# Patient Record
Sex: Female | Born: 1975 | Race: White | Hispanic: No | Marital: Married | Smoking: Never smoker
Health system: Southern US, Community
[De-identification: ages and names within clinical notes are randomized; demographics above are authoritative.]

## PROBLEM LIST (undated history)

## (undated) ENCOUNTER — Inpatient Hospital Stay (HOSPITAL_COMMUNITY): Payer: Self-pay

## (undated) DIAGNOSIS — M25569 Pain in unspecified knee: Secondary | ICD-10-CM

## (undated) DIAGNOSIS — M722 Plantar fascial fibromatosis: Secondary | ICD-10-CM

## (undated) DIAGNOSIS — R519 Headache, unspecified: Secondary | ICD-10-CM

## (undated) DIAGNOSIS — T148XXA Other injury of unspecified body region, initial encounter: Secondary | ICD-10-CM

## (undated) DIAGNOSIS — M7660 Achilles tendinitis, unspecified leg: Secondary | ICD-10-CM

## (undated) DIAGNOSIS — F419 Anxiety disorder, unspecified: Secondary | ICD-10-CM

## (undated) DIAGNOSIS — I10 Essential (primary) hypertension: Secondary | ICD-10-CM

## (undated) DIAGNOSIS — R51 Headache: Secondary | ICD-10-CM

## (undated) HISTORY — DX: Anxiety disorder, unspecified: F41.9

## (undated) HISTORY — DX: Headache, unspecified: R51.9

## (undated) HISTORY — PX: WRIST FRACTURE SURGERY: SHX121

## (undated) HISTORY — DX: Other injury of unspecified body region, initial encounter: T14.8XXA

## (undated) HISTORY — DX: Essential (primary) hypertension: I10

## (undated) HISTORY — PX: WISDOM TOOTH EXTRACTION: SHX21

## (undated) HISTORY — DX: Headache: R51

---

## 1999-05-01 ENCOUNTER — Other Ambulatory Visit: Admission: RE | Admit: 1999-05-01 | Discharge: 1999-05-01 | Payer: Self-pay | Admitting: Gynecology

## 2000-05-01 ENCOUNTER — Other Ambulatory Visit: Admission: RE | Admit: 2000-05-01 | Discharge: 2000-05-01 | Payer: Self-pay | Admitting: Gynecology

## 2001-06-25 ENCOUNTER — Other Ambulatory Visit: Admission: RE | Admit: 2001-06-25 | Discharge: 2001-06-25 | Payer: Self-pay | Admitting: Gynecology

## 2003-02-26 ENCOUNTER — Other Ambulatory Visit: Admission: RE | Admit: 2003-02-26 | Discharge: 2003-02-26 | Payer: Self-pay | Admitting: Obstetrics and Gynecology

## 2003-08-18 ENCOUNTER — Emergency Department (HOSPITAL_COMMUNITY): Admission: EM | Admit: 2003-08-18 | Discharge: 2003-08-18 | Payer: Self-pay | Admitting: Emergency Medicine

## 2004-05-10 ENCOUNTER — Other Ambulatory Visit: Admission: RE | Admit: 2004-05-10 | Discharge: 2004-05-10 | Payer: Self-pay | Admitting: Obstetrics and Gynecology

## 2005-10-31 ENCOUNTER — Other Ambulatory Visit: Admission: RE | Admit: 2005-10-31 | Discharge: 2005-10-31 | Payer: Self-pay | Admitting: Obstetrics and Gynecology

## 2008-05-17 ENCOUNTER — Ambulatory Visit: Payer: Self-pay | Admitting: Sports Medicine

## 2008-05-17 DIAGNOSIS — M7660 Achilles tendinitis, unspecified leg: Secondary | ICD-10-CM | POA: Insufficient documentation

## 2008-05-17 DIAGNOSIS — M2240 Chondromalacia patellae, unspecified knee: Secondary | ICD-10-CM | POA: Insufficient documentation

## 2008-08-25 ENCOUNTER — Ambulatory Visit: Payer: Self-pay | Admitting: Sports Medicine

## 2008-11-02 ENCOUNTER — Ambulatory Visit: Payer: Self-pay | Admitting: Sports Medicine

## 2008-11-02 DIAGNOSIS — R269 Unspecified abnormalities of gait and mobility: Secondary | ICD-10-CM | POA: Insufficient documentation

## 2014-10-12 ENCOUNTER — Ambulatory Visit (INDEPENDENT_AMBULATORY_CARE_PROVIDER_SITE_OTHER): Payer: BLUE CROSS/BLUE SHIELD | Admitting: Family Medicine

## 2014-10-12 ENCOUNTER — Encounter: Payer: Self-pay | Admitting: Family Medicine

## 2014-10-12 VITALS — BP 126/69 | HR 54 | Ht 66.0 in | Wt 135.0 lb

## 2014-10-12 DIAGNOSIS — M722 Plantar fascial fibromatosis: Secondary | ICD-10-CM | POA: Diagnosis not present

## 2014-10-12 NOTE — Progress Notes (Signed)
  Brianna [name] - 39 y.o. female MRN [id number]  Date of birth: November 29, 1975  CC: No chief complaint on file.   SUBJECTIVE:   HPI  Right Heel pain for last 31month:  - Sitting and standing up to walk - NO pain in sneakers when working out -[employer] less supportive shoes hurt the most - Pain in the morning as well.  - IBU helped a little [place] 8-9 miles a day.   - Does role feet out on golf ball  - Ice pack does help a little.   - Although the pain worsens in [place] and is now back to the level it was prior to her trip.  No trouble with achilles issues on the left for which she was previously seen.   Has orthotics for running, made in 2010.  No issues.    ROS:     10 point review of systems negative other than that listed in history of present illness.  HISTORY: Past Medical, Surgical, Social, and Family History Reviewed & Updated per EMR.  Pertinent Historical Findings include: None  OBJECTIVE: BP 126/69 mmHg  Pulse 54  Ht 5' 6"  (1.676 m)  Wt 135 lb (61.236 kg)  BMI 21.80 kg/m2  Physical Exam  General: Well-developed,well-nourished,in no acute distress; alert,appropriate and cooperative throughout examination Msk: Pain over anterior-medial corner of right heel. Bilateral Achilles nontender to palpation Ankle: Full nonlimited range of motion No visible erythema or swelling. Strength is 5/5 in all directions. Stable lateral and medial ligaments. Talar dome nontender; No pain at base of 5th MT; No tenderness over cuboid; Able to walk 4 steps. Stance: Longitudinal and transverse arch appear well maintained. No calcaneus valgus Gait: Minimal overpronation.    MEDICATIONS, LABS & OTHER ORDERS: Previous Medications   IBUPROFEN (ADVIL,MOTRIN) 200 MG TABLET    Take 200 mg by mouth every 6 (six) hours as needed.   TRETINOIN (RETIN-A) 0.025 % CREAM    APPLY TOPICALLY TO SKIN AS DIRECTED.   Modified Medications   No medications on  file   New Prescriptions   No medications on file   Discontinued Medications   No medications on file  No orders of the defined types were placed in this encounter.   ASSESSMENT & PLAN: See problem based charting & AVS for pt instructions.

## 2014-10-13 DIAGNOSIS — M722 Plantar fascial fibromatosis: Secondary | ICD-10-CM | POA: Insufficient documentation

## 2014-10-13 DIAGNOSIS — M7741 Metatarsalgia, right foot: Secondary | ICD-10-CM | POA: Insufficient documentation

## 2014-10-13 DIAGNOSIS — M7742 Metatarsalgia, left foot: Secondary | ICD-10-CM | POA: Insufficient documentation

## 2014-10-13 NOTE — Assessment & Plan Note (Signed)
Patient with plantar fasciitis. The pain quickly resolves after beginning in activity, including walking for a few steps. We discussed various treatments. She is given the handout about doing exercises. We discussed icing and a tub of water versus a frozen water bottle. Unfortunately we did not have time to ultrasound her plantar fascia today. We did discuss the possibility of an injection although there is some risk for fat pad atrophy. She has not been doing any home treatments. She will begin stretching several times a day. She can follow-up as needed in 6-8 weeks if she is not improving. We did discuss the long recovery that is frequently common with plantar fasciitis.

## 2015-05-04 ENCOUNTER — Ambulatory Visit (INDEPENDENT_AMBULATORY_CARE_PROVIDER_SITE_OTHER): Payer: BLUE CROSS/BLUE SHIELD | Admitting: Sports Medicine

## 2015-05-04 VITALS — BP 126/72 | HR 58 | Ht 66.0 in | Wt 130.0 lb

## 2015-05-04 DIAGNOSIS — M545 Low back pain, unspecified: Secondary | ICD-10-CM | POA: Insufficient documentation

## 2015-05-04 DIAGNOSIS — M25562 Pain in left knee: Secondary | ICD-10-CM | POA: Diagnosis not present

## 2015-05-04 DIAGNOSIS — M25561 Pain in right knee: Secondary | ICD-10-CM | POA: Diagnosis not present

## 2015-05-04 DIAGNOSIS — M722 Plantar fascial fibromatosis: Secondary | ICD-10-CM | POA: Diagnosis not present

## 2015-05-04 NOTE — Progress Notes (Signed)
   Subjective:    Patient ID: Brianna [name], female    DOB: 05-22-1975, 40 y.o.   MRN: [id number]  HPI  40 yo female with hx of plantar fascitis and achillis tendonitis previously seen on 09/2014, now presents with low back pain and bilateral knee pain.  Knee pain: had this for many years, occasionally flares up, it's mainly on the anterior medial aspect of the knee joint, hurts more when she starts running, gets better after few minutes. Does not feel the pain much with rest. NSAIDs help with the pain. PT helped in the past. Wants to try that again.  Back pain: started 2 months ago after lifting a 50 lb dog. R>L. Located on lower back without radiation. No weakness, bowel or bladder problems.   Past Hx: Note that her PF and AT responded well to orthotics.  These are now 41 years old.  I reviewed the orthtotics and they still appear to be structurally sound.    Review of Systems  Constitutional: Negative for fever and chills.  Musculoskeletal: Positive for back pain and arthralgias. Negative for joint swelling, gait problem, neck pain and neck stiffness.  Neurological: Negative for dizziness, weakness and numbness.       Objective:   Physical Exam  Constitutional: She is oriented to person, place, and time. She appears well-developed and well-nourished. No distress.  HENT:  Head: Normocephalic and atraumatic.  Eyes: Conjunctivae are normal. Right eye exhibits no discharge. Left eye exhibits no discharge.  Musculoskeletal:  Has Left hip aBductor muscle weakness. Some peripatellar tenderness on the medial aspect of both knee joints. Has clicking on both knee joints, L>R.  Has tenderness over the right SI joint.  Has lumbar hyperlordosis.   Neurological: She is alert and oriented to person, place, and time.  Skin: Skin is warm. No rash noted. She is not diaphoretic.   Knee motion on left tracks laterally  Filed Vitals:   05/04/15 1120  BP: 126/72  Pulse: 58        Assessment  & Plan:  See problem based a&P.

## 2015-05-04 NOTE — Assessment & Plan Note (Signed)
Has bilateral knee pain with peripatellar tenderness and clicking L>R. This is likely 2/2 to weakness of her core muscles and hip muscles, especially weakness of left hip ABDuctor muscles.  - gave exercise to strengthen her hip and core muscles. Continue NSAID as needed.  - OK to keep running.

## 2015-05-04 NOTE — Assessment & Plan Note (Addendum)
Low back pain R>L, with tenderness over the right SI joint and hyperlordosis of lumbar spine.  With her hyperlodosis, she perhaps irritated her SI joint or back muscle after picking up a 50lb dog 2 months ago.  - gave her back flexion exercise series along with her hip exercises to use for PFP  She should show excellent progress with good HEP If not resolved in 6 wks - suggest RTC

## 2015-05-04 NOTE — Assessment & Plan Note (Signed)
Resolved on HX with use of custom orthotics

## 2015-09-20 DIAGNOSIS — E288 Other ovarian dysfunction: Secondary | ICD-10-CM | POA: Diagnosis not present

## 2015-09-26 DIAGNOSIS — N8500 Endometrial hyperplasia, unspecified: Secondary | ICD-10-CM | POA: Diagnosis not present

## 2015-10-07 DIAGNOSIS — L821 Other seborrheic keratosis: Secondary | ICD-10-CM | POA: Diagnosis not present

## 2015-10-07 DIAGNOSIS — D2261 Melanocytic nevi of right upper limb, including shoulder: Secondary | ICD-10-CM | POA: Diagnosis not present

## 2015-10-07 DIAGNOSIS — D225 Melanocytic nevi of trunk: Secondary | ICD-10-CM | POA: Diagnosis not present

## 2015-10-07 DIAGNOSIS — D2262 Melanocytic nevi of left upper limb, including shoulder: Secondary | ICD-10-CM | POA: Diagnosis not present

## 2015-10-17 DIAGNOSIS — E288 Other ovarian dysfunction: Secondary | ICD-10-CM | POA: Diagnosis not present

## 2015-10-17 DIAGNOSIS — Z319 Encounter for procreative management, unspecified: Secondary | ICD-10-CM | POA: Diagnosis not present

## 2015-11-05 DIAGNOSIS — E288 Other ovarian dysfunction: Secondary | ICD-10-CM | POA: Diagnosis not present

## 2015-11-16 DIAGNOSIS — Z3183 Encounter for assisted reproductive fertility procedure cycle: Secondary | ICD-10-CM | POA: Diagnosis not present

## 2015-11-19 DIAGNOSIS — N840 Polyp of corpus uteri: Secondary | ICD-10-CM | POA: Diagnosis not present

## 2015-11-21 DIAGNOSIS — N8500 Endometrial hyperplasia, unspecified: Secondary | ICD-10-CM | POA: Diagnosis not present

## 2015-12-14 DIAGNOSIS — N840 Polyp of corpus uteri: Secondary | ICD-10-CM | POA: Diagnosis not present

## 2015-12-20 DIAGNOSIS — N840 Polyp of corpus uteri: Secondary | ICD-10-CM | POA: Diagnosis not present

## 2016-01-16 NOTE — L&D Delivery Note (Signed)
Operative Delivery Note As previously stated a vacuum assisted delivery was decided upon due to FHR declerations.  The patient had pushed the baby to a +2 station OP presentation.  The vacuum was applied in the green zone 3 separate times with only one pop-off.  In between applications the patient was allowed to push on her own.  At 11:52 AM a healthy female was delivered via Vaginal, Vacuum Neurosurgeon).  Presentation: vertex; Position: Occiput,, Posterior; Station: +2.  Verbal consent: obtained from patient.  Risks and benefits discussed in detail.  Risks include, but are not limited to the risks of anesthesia, bleeding, infection, damage to maternal tissues, fetal cephalhematoma.  There is also the risk of inability to effect vaginal delivery of the head, or shoulder dystocia that cannot be resolved by established maneuvers, leading to the need for emergency cesarean section.  APGAR: 9, 9; weight pending .   Placenta status: had to be manually extracted (see below).   Cord:  with the following complications: nuchal x 1 .   Anesthesia:  epidural Instruments: soft bell vacuum Episiotomy: None Lacerations: 2nd degree with rectal sphincter reinforced Suture Repair: 3.0 vicryl rapide and 2-0 vicryl Est. Blood Loss (mL): 357m  After delivery of the baby pitocin was immediately started.  After waiting for 20 minutes, the placenta appeared to separate.  Fundal massage would not get the placenta to deliver, so manual extraction was attempted.  The placenta was partially detached, but still adherent at the top of fundus.  Multiple attempts were made at fundal exploration with fragments removed.  There was no excessive bleeding, but could not be confirmed that all tissue was out.  Cytotec was placed rectally and situation d/w pt.  Will observe bleeding and check CBC  Magnesium to begin now  Mom to postpartum.  Baby to Couplet care / Skin to Skin.  KLogan Bores11/14/2018, 1:19 PM

## 2016-02-10 DIAGNOSIS — N840 Polyp of corpus uteri: Secondary | ICD-10-CM | POA: Diagnosis not present

## 2016-03-14 DIAGNOSIS — E288 Other ovarian dysfunction: Secondary | ICD-10-CM | POA: Diagnosis not present

## 2016-03-16 DIAGNOSIS — Z3201 Encounter for pregnancy test, result positive: Secondary | ICD-10-CM | POA: Diagnosis not present

## 2016-03-28 DIAGNOSIS — Z3200 Encounter for pregnancy test, result unknown: Secondary | ICD-10-CM | POA: Diagnosis not present

## 2016-04-03 ENCOUNTER — Ambulatory Visit: Payer: Self-pay | Admitting: *Deleted

## 2016-04-16 DIAGNOSIS — O0900 Supervision of pregnancy with history of infertility, unspecified trimester: Secondary | ICD-10-CM | POA: Diagnosis not present

## 2016-05-03 DIAGNOSIS — O09511 Supervision of elderly primigravida, first trimester: Secondary | ICD-10-CM | POA: Diagnosis not present

## 2016-05-03 DIAGNOSIS — Z3689 Encounter for other specified antenatal screening: Secondary | ICD-10-CM | POA: Diagnosis not present

## 2016-05-03 DIAGNOSIS — Z3A11 11 weeks gestation of pregnancy: Secondary | ICD-10-CM | POA: Diagnosis not present

## 2016-05-03 DIAGNOSIS — O09811 Supervision of pregnancy resulting from assisted reproductive technology, first trimester: Secondary | ICD-10-CM | POA: Diagnosis not present

## 2016-05-03 DIAGNOSIS — Z113 Encounter for screening for infections with a predominantly sexual mode of transmission: Secondary | ICD-10-CM | POA: Diagnosis not present

## 2016-05-03 LAB — OB RESULTS CONSOLE HIV ANTIBODY (ROUTINE TESTING): HIV: NONREACTIVE

## 2016-05-03 LAB — OB RESULTS CONSOLE GC/CHLAMYDIA
Chlamydia: NEGATIVE
Gonorrhea: NEGATIVE

## 2016-05-03 LAB — OB RESULTS CONSOLE RPR: RPR: NONREACTIVE

## 2016-05-03 LAB — OB RESULTS CONSOLE HEPATITIS B SURFACE ANTIGEN: HEP B S AG: NEGATIVE

## 2016-05-03 LAB — OB RESULTS CONSOLE ABO/RH: RH TYPE: NEGATIVE

## 2016-05-03 LAB — OB RESULTS CONSOLE RUBELLA ANTIBODY, IGM: RUBELLA: IMMUNE

## 2016-05-03 LAB — OB RESULTS CONSOLE ANTIBODY SCREEN: Antibody Screen: NEGATIVE

## 2016-05-05 ENCOUNTER — Emergency Department (HOSPITAL_COMMUNITY)
Admission: EM | Admit: 2016-05-05 | Discharge: 2016-05-05 | Disposition: A | Discharge status: Home / Self Care | Payer: BLUE CROSS/BLUE SHIELD | Attending: Emergency Medicine | Admitting: Emergency Medicine

## 2016-05-05 ENCOUNTER — Encounter (HOSPITAL_COMMUNITY): Payer: Self-pay | Admitting: Emergency Medicine

## 2016-05-05 DIAGNOSIS — O99511 Diseases of the respiratory system complicating pregnancy, first trimester: Secondary | ICD-10-CM | POA: Diagnosis not present

## 2016-05-05 DIAGNOSIS — Z3A11 11 weeks gestation of pregnancy: Secondary | ICD-10-CM | POA: Insufficient documentation

## 2016-05-05 DIAGNOSIS — Z79899 Other long term (current) drug therapy: Secondary | ICD-10-CM | POA: Insufficient documentation

## 2016-05-05 DIAGNOSIS — R05 Cough: Secondary | ICD-10-CM | POA: Insufficient documentation

## 2016-05-05 DIAGNOSIS — R059 Cough, unspecified: Secondary | ICD-10-CM

## 2016-05-05 DIAGNOSIS — R0789 Other chest pain: Secondary | ICD-10-CM | POA: Diagnosis not present

## 2016-05-05 DIAGNOSIS — O9989 Other specified diseases and conditions complicating pregnancy, childbirth and the puerperium: Secondary | ICD-10-CM | POA: Insufficient documentation

## 2016-05-05 DIAGNOSIS — J029 Acute pharyngitis, unspecified: Secondary | ICD-10-CM

## 2016-05-05 NOTE — ED Provider Notes (Signed)
[facility] DEPT Provider Note   CSN: [id number] Arrival date & time: 05/05/16  1133  By signing my name below, I, Sonum Patel, attest that this documentation has been prepared under the direction and in the presence of [employer]. Electronically Signed: Sonum Patel, Education administrator. 05/05/16. 12:45 PM.  History   Chief Complaint Chief Complaint  Patient presents with  . Cough  . Nasal Congestion  . Chest Pain    The history is provided by the patient. No language interpreter was used.     HPI Comments: Brianna [name] is a G1P0, [redacted]w[redacted]d 41y.o. female who presents to the Emergency Department complaining of 2-3 days of bilateral lower anterior rib pain, right worse than left. She states the pain is unchanged with exertion but is aggravated by cough, deep breaths, and palpation (right only). She rates her pain as 2-3/10 currently and 8/10 at worst. She also complains of a sore throat, congestion, cough, rhinorrhea, watery eyes that has been ongoing for the last few weeks. She initially tried Robitussin and lozenges with some relief. Recently she tried anti-histamines with relief. She states the cough is mostly dry but occasionally productive of clear sputum in the morning. She states most of her symptoms improve throughout the day and return at night. She further complains of SOB with running; states this is new for her but resolves after the exercise. She denies abdominal pain, vomiting, vaginal bleeding/spotting, neck pain, back pain. She denies history of allergies or exertion induced asthma.   History reviewed. No pertinent past medical history.  Patient Active Problem List   Diagnosis Date Noted  . Low back pain 05/04/2015  . Bilateral knee pain 05/04/2015  . Plantar fasciitis, bilateral 10/13/2014  . ABNORMALITY OF GAIT 11/02/2008    Past Surgical History:  Procedure Laterality Date  . WRIST FRACTURE SURGERY     r/wrist    OB History    Gravida Para Term Preterm AB Living   1              SAB TAB Ectopic Multiple Live Births                   Home Medications    Prior to Admission medications   Medication Sig Start Date End Date Taking? Authorizing Provider  fluorometholone (FML) 0.1 % ophthalmic suspension INSTILL 1 DROP INTO BOTH EYES FOUR TIMES A DAY AS DIRECTED 04/22/15   Historical Provider, MD  ibuprofen (ADVIL,MOTRIN) 200 MG tablet Take 200 mg by mouth every 6 (six) hours as needed.    Historical Provider, MD  PAZEO 0.7 % SOLN INSTILL 1 DROP INTO BOTH EYES EVERY MORNING 04/11/15   Historical Provider, MD  tretinoin (RETIN-A) 0.025 % cream APPLY TOPICALLY TO SKIN AS DIRECTED. 09/03/14   Historical Provider, MD    Family History Family History  Problem Relation Age of Onset  . Hyperlipidemia Mother   . Hyperlipidemia Father     Social History Social History  Substance Use Topics  . Smoking status: Never Smoker  . Smokeless tobacco: Never Used  . Alcohol use No     Comment: pregnant     Allergies   Sulfa antibiotics   Review of Systems Review of Systems  HENT: Positive for congestion, rhinorrhea and sore throat.   Respiratory: Positive for cough and shortness of breath.   Cardiovascular: Positive for chest pain.  Gastrointestinal: Positive for nausea. Negative for abdominal pain and vomiting.  Genitourinary: Negative for vaginal bleeding and vaginal discharge.  Musculoskeletal: Negative for back pain and neck pain.     Physical Exam Updated Vital Signs BP 133/81   Pulse 60   Temp 97.8 F (36.6 C) (Oral)   Resp 16   Ht 5' 6"  (1.676 m)   Wt 63.5 kg   LMP 02/16/2016 (Exact Date)   SpO2 100%   BMI 22.60 kg/m   Physical Exam  Constitutional: She is oriented to person, place, and time. She appears well-developed and well-nourished.  HENT:  Head: Normocephalic and atraumatic.  TMs normal bilaterally without bulging or erythema. Nose with pink mucosa and mild green nasal discharge. Posterior pharynx mildly erythematous without  tonsillar hypertrophy. No exudates or uvular deviation.   Neck: Normal range of motion. Neck supple. No JVD present. No tracheal deviation present. No thyromegaly present.  Cardiovascular: Normal rate, regular rhythm, normal heart sounds and intact distal pulses.   2+ radial pulses bilaterally   Pulmonary/Chest: Effort normal and breath sounds normal. No respiratory distress. She has no wheezes. She has no rales. She exhibits tenderness.  Tenderness to palpation along right great costal margin, particularly under bra underwire. No deformity, bruising, or paradoxical motion of chest wall. Equal rise and fall of chest.   Abdominal: Soft. She exhibits no distension. There is no tenderness.  Lymphadenopathy:    She has no cervical adenopathy.  Neurological: She is alert and oriented to person, place, and time.  Skin: Skin is warm and dry.  Psychiatric: She has a normal mood and affect. Her behavior is normal.  Nursing note and vitals reviewed.    ED Treatments / Results  DIAGNOSTIC STUDIES: Oxygen Saturation is 100% on RA, normal by my interpretation.    COORDINATION OF CARE: 12:30 PM Discussed treatment plan with pt at bedside and pt agreed to plan.   Labs (all labs ordered are listed, but only abnormal results are displayed) Labs Reviewed - No data to display  EKG  EKG Interpretation None       Radiology No results found.  Procedures Procedures (including critical care time)  Medications Ordered in ED Medications - No data to display   Initial Impression / Assessment and Plan / ED Course  I have reviewed the triage vital signs and the nursing notes.  Pertinent labs & imaging results that were available during my care of the patient were reviewed by me and considered in my medical decision making (see chart for details).     Patient who is [redacted] weeks pregnant with anterior rib pain and associated cough and increased shortness of breath with exercise. Patient afebrile,  vital signs stable, SpO2 100% on RA. No wheezing on auscultation of lungs. Do not suspect pneumonia, bronchitis, or other cardiopulmonary abnormality. CXR not indicated at this time. Anterior rib pain is reproducible on palpation. Likely myofascial in nature, possible costochondritis. Pt will be discharged with symptomatic treatment, including Tylenol, ice/heat to the effected area for pain and avoidance of excessive exercise that will aggravate symptoms.  She will continue to use Claritin for her nasal congestion as it is helpful. Discussed strict return precautions. Patient will follow up with OB/GYN for reevaluation and further medication management. Pt is hemodynamically stable & in NAD prior to discharge. Pt verbalized understanding of and agreement with plan and is safe for discharge home at this time   Final Clinical Impressions(s) / ED Diagnoses   Final diagnoses:  Chest wall pain  Cough  Acute sore throat    New Prescriptions Discharge Medication List as of 05/05/2016 12:54  PM     I personally performed the services described in this documentation, which was scribed in my presence. The recorded information has been reviewed and is accurate.    Renita Papa, PA-C 05/06/16 2143    Forde Dandy, MD 05/09/16 1250

## 2016-05-05 NOTE — ED Triage Notes (Signed)
Pt reports a 4 week hx of pain in bilateral lower rib area. Non -productive cough x 4 weeks. Seen PCP 2 weeks ago. Denies fever. Shortness of breath while exercising . Sore throat at night.

## 2016-05-05 NOTE — Discharge Instructions (Signed)
Tylenol is okay for pain. Applying heat or ice to the affected area may be helpful. Continue to use Claritin for your nasal congestion and sore throat. Follow up with your PCP or OBGYN if symptoms worsen. If you develop worsening shortness of breath, severe ongoing pain, or any other concerning symptoms, return to the ED.

## 2016-05-14 DIAGNOSIS — Z3A12 12 weeks gestation of pregnancy: Secondary | ICD-10-CM | POA: Diagnosis not present

## 2016-05-14 DIAGNOSIS — O09511 Supervision of elderly primigravida, first trimester: Secondary | ICD-10-CM | POA: Diagnosis not present

## 2016-05-14 DIAGNOSIS — Z3682 Encounter for antenatal screening for nuchal translucency: Secondary | ICD-10-CM | POA: Diagnosis not present

## 2016-05-14 DIAGNOSIS — Z1379 Encounter for other screening for genetic and chromosomal anomalies: Secondary | ICD-10-CM | POA: Diagnosis not present

## 2016-05-26 ENCOUNTER — Inpatient Hospital Stay (HOSPITAL_COMMUNITY)
Admission: AD | Admit: 2016-05-26 | Discharge: 2016-05-26 | Disposition: A | Discharge status: Home / Self Care | Payer: BLUE CROSS/BLUE SHIELD | Source: Ambulatory Visit | Attending: Obstetrics and Gynecology | Admitting: Obstetrics and Gynecology

## 2016-05-26 ENCOUNTER — Encounter (HOSPITAL_COMMUNITY): Payer: Self-pay | Admitting: *Deleted

## 2016-05-26 DIAGNOSIS — Z6791 Unspecified blood type, Rh negative: Secondary | ICD-10-CM | POA: Diagnosis not present

## 2016-05-26 DIAGNOSIS — Z79899 Other long term (current) drug therapy: Secondary | ICD-10-CM | POA: Diagnosis not present

## 2016-05-26 DIAGNOSIS — O209 Hemorrhage in early pregnancy, unspecified: Secondary | ICD-10-CM | POA: Insufficient documentation

## 2016-05-26 DIAGNOSIS — O26892 Other specified pregnancy related conditions, second trimester: Secondary | ICD-10-CM

## 2016-05-26 DIAGNOSIS — O4692 Antepartum hemorrhage, unspecified, second trimester: Secondary | ICD-10-CM

## 2016-05-26 DIAGNOSIS — Z3A14 14 weeks gestation of pregnancy: Secondary | ICD-10-CM | POA: Insufficient documentation

## 2016-05-26 LAB — URINALYSIS, ROUTINE W REFLEX MICROSCOPIC
BILIRUBIN URINE: NEGATIVE
GLUCOSE, UA: NEGATIVE mg/dL
KETONES UR: NEGATIVE mg/dL
LEUKOCYTES UA: NEGATIVE
NITRITE: NEGATIVE
PH: 5.5 (ref 5.0–8.0)
PROTEIN: NEGATIVE mg/dL
Specific Gravity, Urine: <1.005 — ABNORMAL LOW (ref 1.005–1.030)

## 2016-05-26 LAB — URINALYSIS, MICROSCOPIC (REFLEX): WBC UA: NONE SEEN WBC/hpf (ref 0–5)

## 2016-05-26 MED ORDER — RHO D IMMUNE GLOBULIN 1500 UNIT/2ML IJ SOSY
300.0000 ug | PREFILLED_SYRINGE | Freq: Once | INTRAMUSCULAR | Status: AC
Start: 2016-05-26 — End: 2016-05-26
  Administered 2016-05-26: 300 ug via INTRAMUSCULAR
  Filled 2016-05-26: qty 2

## 2016-05-26 NOTE — Discharge Instructions (Signed)
Rh Incompatibility Rh incompatibility is a condition that occurs during pregnancy if a woman has Rh-negative blood and her baby has Rh-positive blood. "Rh-negative" and "Rh-positive" refer to whether or not the blood has an Rh factor. An Rh factor is a specific protein found on the surface of red blood cells. If a woman has Rh factor, she is Rh-positive. If she does not have an Rh factor, she is Rh-negative. Having or not having an Rh factor does not affect the mothers general health. However, it can cause problems during pregnancy. What kind of problems can Rh incompatibility cause? During pregnancy, blood from the baby can cross into the mothers bloodstream, especially during delivery. If a mother is Rh-negative and the baby is Rh-positive, the mothers defense system will react to the baby's blood as if it was a foreign substance and will create proteins (antibodies). This is called sensitization. Once the mother is sensitized, her Rh antibodies will cross the placenta to the baby and attack the babys Rh-positive blood as if it is a harmful substance. Rh incompatibility can also happen if the Rh-negative pregnant woman is exposed to the Rh factor during a blood transfusion with Rh-positive blood. How does this condition affect my baby? The Rh antibodies that attack and destroy the babys red blood cells can lead to hemolytic disease in the baby. Hemolytic disease is when the red blood cells break down. This can cause:  Yellowing of the skin and eyes (jaundice).  The body to not have enough healthy red blood cells (anemia).  Brain damage.  Heart failure.  Death. These antibodies usually do not cause problems during a first pregnancy. This is because the blood from the baby often times crosses into the mothers bloodstream during delivery, and the baby is born before many of the antibodies can develop. However, the antibodies stay in your body once they have formed. Because of this, Rh  incompatibility is more likely to cause problems in second or later pregnancies (if the baby is Rh-positive). How is this diagnosed? When a woman becomes pregnant, blood tests may be done to find out her blood type and Rh factor. If the woman is Rh-negative, she also may have another blood test called an antibody screen. The antibody screen shows whether she has Rh antibodies in her blood. If she does, it means she was exposed to Rh-positive blood before, and she is at risk for Rh incompatibility. To find out whether the baby is developing hemolytic anemia and how serious it is, caregivers may use more advanced tests, such as ultrasonography (commonly known as ultrasound). How is Rh incompatibility treated? Rh incompatibility is treated with a shot of medicine called Rho (D) immune globulin. This medicine keeps the woman's body from making antibodies that can cause serious problems in the baby or future babies. Two shots will be given, one at around your seventh month of pregnancy and the other within 72 hours of your baby being born. If you are Rh-negative, you will need this medicine every time you have a baby with Rh-positive blood. If you already have antibodies in your blood, Rho (D) immune globulin will not help. Your doctor will not give you this medicine, but will watch your pregnancy closely for problems instead. This shot may also be given to an Rh-negative woman when the risk of blood transfer between the mom and baby is high. The risk is high with:  An amniocentesis.  A miscarriage or an abortion.  An ectopic pregnancy.  Any vaginal  bleeding during pregnancy. This information is not intended to replace advice given to you by your health care provider. Make sure you discuss any questions you have with your health care provider. Document Released: 06/23/2001 Document Revised: 06/09/2015 Document Reviewed: 04/15/2012 Elsevier Interactive Patient Education  2017 [employer]. Vaginal  Bleeding During Pregnancy, First Trimester A small amount of bleeding (spotting) from the vagina is common in early pregnancy. Sometimes the bleeding is normal and is not a problem, and sometimes it is a sign of something serious. Be sure to tell your doctor about any bleeding from your vagina right away. Follow these instructions at home:  Watch your condition for any changes.  Follow your doctor's instructions about how active you can be.  If you are on bed rest:  You may need to stay in bed and only get up to use the bathroom.  You may be allowed to do some activities.  If you need help, make plans for someone to help you.  Write down:  The number of pads you use each day.  How often you change pads.  How soaked (saturated) your pads are.  Do not use tampons.  Do not douche.  Do not have sex or orgasms until your doctor says it is okay.  If you pass any tissue from your vagina, save the tissue so you can show it to your doctor.  Only take medicines as told by your doctor.  Do not take aspirin because it can make you bleed.  Keep all follow-up visits as told by your doctor. Contact a doctor if:  You bleed from your vagina.  You have cramps.  You have labor pains.  You have a fever that does not go away after you take medicine. Get help right away if:  You have very bad cramps in your back or belly (abdomen).  You pass large clots or tissue from your vagina.  You bleed more.  You feel light-headed or weak.  You pass out (faint).  You have chills.  You are leaking fluid or have a gush of fluid from your vagina.  You pass out while pooping (having a bowel movement). This information is not intended to replace advice given to you by your health care provider. Make sure you discuss any questions you have with your health care provider. Document Released: 05/18/2013 Document Revised: 06/09/2015 Document Reviewed: 09/08/2012 Elsevier Interactive Patient  Education  2017 [employer].

## 2016-05-26 NOTE — MAU Note (Signed)
Pt reports to MAU c/o vaginal bleeding that started around 2000. Pt denies saturating a pad but she did have to wear a panty liner. Pt states she has a "tiny bit of cramping in her lower abdomen" Pt states no other complaints at this time.

## 2016-05-26 NOTE — MAU Provider Note (Signed)
History     CSN: [id number]  Arrival date and time: 05/26/16 2049   First Provider Initiated Contact with Patient 05/26/16 2120      Chief Complaint  Patient presents with  . Vaginal Bleeding    14wk 2d   HPI   [name] is a 41 y.o. female G1P0 @ 47w2dhere in MAU with acute onset of bright red vaginal bleeding that started 1 hour ago. She was poring a glass of water at the time of onset. She has very mild, lower abdominal pain.   OB History    Gravida Para Term Preterm AB Living   1             SAB TAB Ectopic Multiple Live Births                  History reviewed. No pertinent past medical history.  Past Surgical History:  Procedure Laterality Date  . WRIST FRACTURE SURGERY     r/wrist    Family History  Problem Relation Age of Onset  . Hyperlipidemia Mother   . Hyperlipidemia Father     Social History  Substance Use Topics  . Smoking status: Never Smoker  . Smokeless tobacco: Never Used  . Alcohol use 0.0 oz/week     Comment: "sips of beer"     Allergies:  Allergies  Allergen Reactions  . Sulfa Antibiotics Nausea And Vomiting    Prescriptions Prior to Admission  Medication Sig Dispense Refill Last Dose  . acetaminophen (TYLENOL) 160 MG chewable tablet Chew 160 mg by mouth every 6 (six) hours as needed for pain.     .Marland Kitchenibuprofen (ADVIL,MOTRIN) 200 MG tablet Take 200 mg by mouth every 6 (six) hours as needed.   Past Month at Unknown time  . tretinoin (RETIN-A) 0.025 % cream APPLY TOPICALLY TO SKIN AS DIRECTED.  3 Past Week at Unknown time  . fluorometholone (FML) 0.1 % ophthalmic suspension INSTILL 1 DROP INTO BOTH EYES FOUR TIMES A DAY AS DIRECTED  0   . PAZEO 0.7 % SOLN INSTILL 1 DROP INTO BOTH EYES EVERY MORNING  1    Results for orders placed or performed during the hospital encounter of 05/26/16 (from the past 48 hour(s))  Urinalysis, Routine w reflex microscopic     Status: Abnormal   Collection Time: 05/26/16  8:54 PM  Result Value Ref  Range   Color, Urine YELLOW YELLOW   APPearance CLEAR CLEAR   Specific Gravity, Urine <1.005 (L) 1.005 - 1.030   pH 5.5 5.0 - 8.0   Glucose, UA NEGATIVE NEGATIVE mg/dL   Hgb urine dipstick LARGE (A) NEGATIVE   Bilirubin Urine NEGATIVE NEGATIVE   Ketones, ur NEGATIVE NEGATIVE mg/dL   Protein, ur NEGATIVE NEGATIVE mg/dL   Nitrite NEGATIVE NEGATIVE   Leukocytes, UA NEGATIVE NEGATIVE  Urinalysis, Microscopic (reflex)     Status: Abnormal   Collection Time: 05/26/16  8:54 PM  Result Value Ref Range   RBC / HPF 0-5 0 - 5 RBC/hpf   WBC, UA NONE SEEN 0 - 5 WBC/hpf   Bacteria, UA RARE (A) NONE SEEN   Squamous Epithelial / LPF 0-5 (A) NONE SEEN  Rh IG workup (includes ABO/Rh)     Status: None (Preliminary result)   Collection Time: 05/26/16  9:51 PM  Result Value Ref Range   Gestational Age(Wks) 14    ABO/RH(D) A NEG    Antibody Screen NEG    Unit Number [id number]   Blood  Component Type RHIG    Unit division 00    Status of Unit ISSUED    Transfusion Status OK TO TRANSFUSE     Review of Systems  Gastrointestinal: Positive for abdominal pain. Negative for nausea and vomiting.  Genitourinary: Positive for vaginal bleeding. Negative for dysuria.   Physical Exam   Blood pressure (!) 145/73, pulse 73, temperature 98.3 F (36.8 C), temperature source Oral, resp. rate 17, height 5' 6"  (1.676 m), weight 140 lb (63.5 kg), last menstrual period 02/16/2016.  Physical Exam  Constitutional: She is oriented to person, place, and time. She appears well-developed and well-nourished. No distress.  HENT:  Head: Normocephalic.  Eyes: Pupils are equal, round, and reactive to light.  Respiratory: Effort normal.  GI: Soft. She exhibits no distension. There is no tenderness. There is no rebound and no guarding.  Genitourinary:  Genitourinary Comments: Vagina - Small amount of dark red blood, not enough to soak large swab. No pooling of blood.  Cervix - No contact bleeding, no active bleeding,  visually closed.  Bimanual exam: deferred  Chaperone present for exam.   Musculoskeletal: Normal range of motion.  Neurological: She is alert and oriented to person, place, and time.  Skin: Skin is warm. She is not diaphoretic.  Psychiatric: Her behavior is normal.   MAU Course  Procedures  None  MDM  A negative blood type Rhogam given today Discussed with Dr. Melba Coon, bedside US shows active fetus + fetal heart tones via doppler   Assessment and Plan   A:  1. Vaginal bleeding in pregnancy, second trimester   2. Rh negative state in antepartum period, second trimester     P:  Discharge home in stable condition Pelvic rest Bleeding precautions Return to MAU if symptoms worsen  Follow up with OB next week.   Lezlie Lye, NP 05/26/2016 11:14 PM

## 2016-05-26 NOTE — MAU Note (Signed)
Urine sent to lab 

## 2016-05-27 LAB — RH IG WORKUP (INCLUDES ABO/RH)
ABO/RH(D): A NEG
ANTIBODY SCREEN: NEGATIVE
Gestational Age(Wks): 14
UNIT DIVISION: 0

## 2016-05-27 LAB — ABO/RH: ABO/RH(D): A NEG

## 2016-06-06 DIAGNOSIS — O09521 Supervision of elderly multigravida, first trimester: Secondary | ICD-10-CM | POA: Diagnosis not present

## 2016-06-06 DIAGNOSIS — O09511 Supervision of elderly primigravida, first trimester: Secondary | ICD-10-CM | POA: Diagnosis not present

## 2016-06-28 DIAGNOSIS — Z3A19 19 weeks gestation of pregnancy: Secondary | ICD-10-CM | POA: Diagnosis not present

## 2016-06-28 DIAGNOSIS — Z363 Encounter for antenatal screening for malformations: Secondary | ICD-10-CM | POA: Diagnosis not present

## 2016-06-28 DIAGNOSIS — O09512 Supervision of elderly primigravida, second trimester: Secondary | ICD-10-CM | POA: Diagnosis not present

## 2016-07-26 ENCOUNTER — Ambulatory Visit (INDEPENDENT_AMBULATORY_CARE_PROVIDER_SITE_OTHER): Payer: BLUE CROSS/BLUE SHIELD | Admitting: Sports Medicine

## 2016-07-26 ENCOUNTER — Encounter: Payer: Self-pay | Admitting: Sports Medicine

## 2016-07-26 DIAGNOSIS — R269 Unspecified abnormalities of gait and mobility: Secondary | ICD-10-CM

## 2016-07-26 NOTE — Assessment & Plan Note (Signed)
Custom orthotics > 42 years old Likely allowing too much pronation With scaphoid and heel wedges pronation is better controlled  I believe this triggered her heel and shin pain  Test this for 1 month  We need to make her new custom orthotics as older ones are breaking down

## 2016-07-26 NOTE — Progress Notes (Signed)
CC: Brianna [name]  Patient in custom orthotics Placed in custom orthotics in 2010 for rotational change and biomechanical problems Had a Hx of Achilles, PF and other injuries Has done really well w running since getting into orthotics  Now pregnant 3 to 4 mos In past month running causes anterior lateral Brianna pain on [name] Some heel and foot pain on [name] No pain first step of morning  ROS No LBP Heel pain feels different from past PF No sciatica  PE Pleasant pregnant F in NAD BP 120/60   Ht 5' 6"  (1.676 m)   Wt 150 lb (68 kg)   LMP 02/16/2016 (Exact Date)   BMI 24.21 kg/m   [name] ankle Ankle: No visible erythema or swelling. Range of motion is full in all directions. Strength is 5/5 in all directions. Stable lateral and medial ligaments; squeeze test and kleiger test unremarkable; Ligaments show some increased laxity Talar dome nontender; No pain at base of 5th MT; No tenderness over cuboid; No tenderness over N spot or navicular prominence No tenderness on posterior aspects of lateral and medial malleolus No sign of peroneal tendon subluxations; Negative tarsal tunnel tinel's Able to walk 4 steps.  No TTP anterior Brianna Excellent strength on resisted dorsiflexion, inversion and eversion  Running gait - too much pronation with some femoral rotation more on [name] Orthotics appears to be breaking down and not giving as much cushion or arch (41 yrs old)  After scaphoid and heel pads added - much more comfort with gait

## 2016-08-16 ENCOUNTER — Ambulatory Visit (INDEPENDENT_AMBULATORY_CARE_PROVIDER_SITE_OTHER): Payer: BLUE CROSS/BLUE SHIELD | Admitting: Sports Medicine

## 2016-08-16 DIAGNOSIS — R269 Unspecified abnormalities of gait and mobility: Secondary | ICD-10-CM

## 2016-08-16 DIAGNOSIS — M722 Plantar fascial fibromatosis: Secondary | ICD-10-CM | POA: Diagnosis not present

## 2016-08-16 NOTE — Assessment & Plan Note (Signed)
Patient was fitted for a : standard, cushioned, semi-rigid orthotic. The orthotic was heated and afterward the patient stood on the orthotic blank positioned on the orthotic stand. The patient was positioned in subtalar neutral position and 10 degrees of ankle dorsiflexion in a weight bearing stance. After completion of molding, a stable base was applied to the orthotic blank. The blank was ground to a stable position for weight bearing. Size: 7 red EVA Base: blue med. EVA Posting: none Additional orthotic padding:  None  Gait corrected and very comfortable post orthotic  I spent 30 minutes with this patient. Over 50% of visit was spend in counseling and coordination of care for problems with foot support and recurrent pain if not using orthotic.

## 2016-08-16 NOTE — Progress Notes (Signed)
CC: chronic foot and ankle pain  Seem 3 weeks ago Recurrent pain likely from breakdown of old orthotics not providing enough support Additional arch padding and repair did help Known biomechanical problems with gait  Past Hx - now 4 mos pregnant and more foot pressure  ROS No first step pain No heel pain today More  Pain along entire arc   Exam Pleasant F in NAD/ pregnant  BP 128/60   Ht 5' 6"  (1.676 m)   Wt 150 lb (68 kg)   LMP 02/16/2016 (Exact Date)   BMI 24.21 kg/m   Ankle: No visible erythema or swelling. Range of motion is full in all directions. Strength is 5/5 in all directions. Stable lateral and medial ligaments; squeeze test and kleiger test unremarkable; Talar dome nontender; No pain at base of 5th MT; No tenderness over cuboid; No tenderness over N spot or navicular prominence No tenderness on posterior aspects of lateral and medial malleolus No sign of peroneal tendon subluxations; Negative tarsal tunnel tinel's Able to walk 4 steps.  Feet show inward rotation Mild loss of long arch bilat   Walking gait - mildly pronated at rest and with walking Whip kick with running

## 2016-08-16 NOTE — Assessment & Plan Note (Signed)
This seems resolved today

## 2016-08-27 DIAGNOSIS — Z3689 Encounter for other specified antenatal screening: Secondary | ICD-10-CM | POA: Diagnosis not present

## 2016-08-27 DIAGNOSIS — Z23 Encounter for immunization: Secondary | ICD-10-CM | POA: Diagnosis not present

## 2016-08-27 DIAGNOSIS — O360120 Maternal care for anti-D [Rh] antibodies, second trimester, not applicable or unspecified: Secondary | ICD-10-CM | POA: Diagnosis not present

## 2016-08-27 DIAGNOSIS — Z6791 Unspecified blood type, Rh negative: Secondary | ICD-10-CM | POA: Diagnosis not present

## 2016-08-27 DIAGNOSIS — Z3A27 27 weeks gestation of pregnancy: Secondary | ICD-10-CM | POA: Diagnosis not present

## 2016-09-13 DIAGNOSIS — D224 Melanocytic nevi of scalp and neck: Secondary | ICD-10-CM | POA: Diagnosis not present

## 2016-09-13 DIAGNOSIS — L700 Acne vulgaris: Secondary | ICD-10-CM | POA: Diagnosis not present

## 2016-09-13 DIAGNOSIS — D2262 Melanocytic nevi of left upper limb, including shoulder: Secondary | ICD-10-CM | POA: Diagnosis not present

## 2016-09-13 DIAGNOSIS — L821 Other seborrheic keratosis: Secondary | ICD-10-CM | POA: Diagnosis not present

## 2016-10-01 DIAGNOSIS — Z1379 Encounter for other screening for genetic and chromosomal anomalies: Secondary | ICD-10-CM | POA: Diagnosis not present

## 2016-10-11 ENCOUNTER — Ambulatory Visit (INDEPENDENT_AMBULATORY_CARE_PROVIDER_SITE_OTHER): Payer: BLUE CROSS/BLUE SHIELD | Admitting: Sports Medicine

## 2016-10-11 ENCOUNTER — Ambulatory Visit: Payer: Self-pay

## 2016-10-11 VITALS — BP 118/76 | Ht 66.0 in | Wt 160.0 lb

## 2016-10-11 DIAGNOSIS — M84375A Stress fracture, left foot, initial encounter for fracture: Secondary | ICD-10-CM

## 2016-10-11 DIAGNOSIS — M79672 Pain in left foot: Secondary | ICD-10-CM

## 2016-10-11 NOTE — Assessment & Plan Note (Signed)
See OV Rigid post op shoe feels better with walking Arch strap Icing  Rest  Reck 4 wks

## 2016-10-11 NOTE — Progress Notes (Signed)
[facility] [address] [place], [place] [address] Phone: [phone] Fax: [phone]   Patient Name: Brianna [name] Date of Birth: 13-Mar-1975 Medical Record Number: [id number] Gender: female Date of Encounter: 10/11/2016  History of Present Illness:  Brianna [name] is a 41 y.o. very pleasant female patient who presents with the following:  Left foot pain for 2.5 weeks. Patient reports pain over the dorsal aspect of her left foot that is worse with weight bearing activities. Her pain has been limiting her ability to walk, exercise, and participate in yoga. She is currently 7 months pregnant. Last seen here on 08/16/16 and fitted for a standard cushioned semi-rigid orthotic due to gait abnormality causing heel pain. Her heal pain has improved significantly with the inserts, but developed new progressive pain on her dorsal left foot over the past few weeks. She recently returned from a trip to [place] where she spent a lot of time walking.   Patient Active Problem List   Diagnosis Date Noted  . Low back pain 05/04/2015  . Bilateral knee pain 05/04/2015  . Plantar fasciitis, bilateral 10/13/2014  . ABNORMALITY OF GAIT 11/02/2008   No past medical history on file. Past Surgical History:  Procedure Laterality Date  . WRIST FRACTURE SURGERY     r/wrist   Social History  Substance Use Topics  . Smoking status: Never Smoker  . Smokeless tobacco: Never Used  . Alcohol use 0.0 oz/week     Comment: "sips of beer"    Family History  Problem Relation Age of Onset  . Hyperlipidemia Mother   . Hyperlipidemia Father    Allergies  Allergen Reactions  . Sulfa Antibiotics Nausea And Vomiting    Medication list has been reviewed and updated.  Prior to Admission medications   Medication Sig Start Date End Date Taking? Authorizing Provider  acetaminophen (TYLENOL) 160 MG chewable tablet Chew 160 mg by mouth every 6 (six) hours as needed for pain.    [provider]  fluorometholone (FML) 0.1 % ophthalmic suspension INSTILL 1 DROP INTO BOTH EYES FOUR TIMES A DAY AS DIRECTED 04/22/15   [provider]  PAZEO 0.7 % SOLN INSTILL 1 DROP INTO BOTH EYES EVERY MORNING 04/11/15   [provider]    Review of Systems:  Endorses some mild bilateral feet swelling, endorses pain on her left foot. Denies heel pain.   Physical Examination: Vitals:   10/11/16 0906  BP: 118/76   Vitals:   10/11/16 0906  Weight: 160 lb (72.6 kg)  Height: 5' 6"  (1.676 m)   Body mass index is 25.82 kg/m.  Constitutional: NAD, pregnant  Extremities: Trace LE edema bilaterally. Left foot with point tenderness over the 3rd and 4th metatarsal. Strength and ROM intact.  Gait: Walks with limp to avoid bearing weight on left foot   Ultrasound of left foot   periosteal edema  irregularities of both the 3rd and 4th metatarsal concerning for possible stress fracture. Small cortical irregularity of 3rd MT with microcalcifications in area of TTP  Impression:  Consistent with early stress reaction or stress fracture of 3rd left metatarsal  Ultrasound and interpretation by Wolfgang Phoenix. Kyjuan Gause, MD   Assessment and Plan:  Stress Fracture: Pin point tenderness over the dorsal aspect of there 3rd and 4th metatarsal and ultrasound findings concerning for a stress fracture in the setting of pregnancy and likely a low calcium state.  -- Placed in post op rigid shoe -- Avoid exercise and weight bearing  activities for 3-4 weeks -- Return in 3 weeks for repeat ultrasound  -- Calcium supplements 1500 - 2500 mg QD   Velna Ochs, MD   I observed and examined the patient with the resident and agree with assessment and plan.  Note reviewed and modified by me. Stefanie Libel, MD

## 2016-10-15 DIAGNOSIS — Z3A34 34 weeks gestation of pregnancy: Secondary | ICD-10-CM | POA: Diagnosis not present

## 2016-10-15 DIAGNOSIS — Z23 Encounter for immunization: Secondary | ICD-10-CM | POA: Diagnosis not present

## 2016-10-23 DIAGNOSIS — Z3685 Encounter for antenatal screening for Streptococcus B: Secondary | ICD-10-CM | POA: Diagnosis not present

## 2016-10-23 DIAGNOSIS — Z3A35 35 weeks gestation of pregnancy: Secondary | ICD-10-CM | POA: Diagnosis not present

## 2016-10-23 LAB — OB RESULTS CONSOLE GBS: GBS: NEGATIVE

## 2016-11-01 ENCOUNTER — Ambulatory Visit (INDEPENDENT_AMBULATORY_CARE_PROVIDER_SITE_OTHER): Payer: BLUE CROSS/BLUE SHIELD | Admitting: Sports Medicine

## 2016-11-01 DIAGNOSIS — M84375D Stress fracture, left foot, subsequent encounter for fracture with routine healing: Secondary | ICD-10-CM | POA: Diagnosis not present

## 2016-11-05 NOTE — Progress Notes (Signed)
   HPI  CC: left foot pain Patient is presenting today for follow-up with her leftfoot pain.Patient states that she has been doing well. At the last visit she was placed in a postop shoe. She states that she has been compliant to a certain point, but recently transitioned to a more rigid but cushioned tennis shoe. She states the is feeling well overall and is pleased with her current progress.  Medications/Interventions Tried: wooden shoe, rest, calcium supplements  See HPI and/or previous note for associated ROS.  Objective: BP 120/76   Ht 5' 6"  (1.676 m)   Wt 170 lb (77.1 kg)   LMP 02/16/2016 (Exact Date)   BMI 27.44 kg/m  Gen: NAD, well groomed, a/o x3, normal affect.  CV: Well-perfused. Warm.  Resp: Non-labored.  Neuro: Sensation intact throughout. No gross coordination deficits.  Gait: Nonpathologic posture, unremarkable stride without signs of limp or balance issues. Ankle/Foot, Left: TTP very mild over the 3rd and 4th MT bases ( dorsal surface). No visible erythema, swelling, ecchymosis, or bony deformity. No notable pes planus/cavus deformity. Transverse arch grossly intact; No evidence of tibiotalar deviation; Range of motion is full in all directions. Strength is 5/5 in all directions. No tenderness at the insertion/body/myotendinous junction of the Achilles tendon; No peroneal tendon tenderness or subluxation; No tenderness on posterior aspects of lateral and medial malleolus; Talar dome nontender; Unremarkable calcaneal squeeze; No tenderness over the navicular prominence; No tenderness over cuboid; Able to walk 4 steps.   ULTRASOUND: Left Foot  Quick, limited diagnostic ultrasound obtained of patient's Left Metatarsals.  - transverse and short axis views of the third and fourth metatarsals showed  A normal 4th metatarsal. 3rd metatarsal with bony callus formation, increased  vascularity, and decreased periosteal fluid accumulation. No evidence of complete fracture, but 3rd  metatarsal showed irregularity over the bony surface/cortex suggesting previous incomplete stress fracture.   Assessment and plan:  Stress fracture of metatarsal bone of left foot Patient is presentiay with improved symptoms bony callus formation on ultrasound. findings are significantly reassuring. - heel cup provided today - Out of wooden shoe today - Activity as tolerated - Follow-up when necessary   Elberta Leatherwood, MD,MS [facility] 11/05/2016 7:53 AM

## 2016-11-05 NOTE — Assessment & Plan Note (Signed)
Patient is presentiay with improved symptoms bony callus formation on ultrasound. findings are significantly reassuring. - heel cup provided today - Out of wooden shoe today - Activity as tolerated - Follow-up when necessary

## 2016-11-08 DIAGNOSIS — Z3A38 38 weeks gestation of pregnancy: Secondary | ICD-10-CM | POA: Diagnosis not present

## 2016-11-08 DIAGNOSIS — O368199 Decreased fetal movements, unspecified trimester, other fetus: Secondary | ICD-10-CM | POA: Diagnosis not present

## 2016-11-20 ENCOUNTER — Telehealth (HOSPITAL_COMMUNITY): Payer: Self-pay | Admitting: *Deleted

## 2016-11-20 ENCOUNTER — Encounter (HOSPITAL_COMMUNITY): Payer: Self-pay | Admitting: *Deleted

## 2016-11-20 NOTE — Telephone Encounter (Signed)
Preadmission screen  

## 2016-11-27 ENCOUNTER — Encounter (HOSPITAL_COMMUNITY): Payer: Self-pay | Admitting: Certified Registered Nurse Anesthetist

## 2016-11-27 ENCOUNTER — Inpatient Hospital Stay (HOSPITAL_COMMUNITY)
Admission: AD | Admit: 2016-11-27 | Discharge: 2016-12-04 | DRG: 798 | Disposition: A | Discharge status: Rehabilitation Facility | Payer: BLUE CROSS/BLUE SHIELD | Source: Ambulatory Visit | Attending: Obstetrics and Gynecology | Admitting: Obstetrics and Gynecology

## 2016-11-27 ENCOUNTER — Encounter (HOSPITAL_COMMUNITY): Payer: Self-pay | Admitting: *Deleted

## 2016-11-27 DIAGNOSIS — I10 Essential (primary) hypertension: Secondary | ICD-10-CM | POA: Diagnosis not present

## 2016-11-27 DIAGNOSIS — O9A23 Injury, poisoning and certain other consequences of external causes complicating the puerperium: Secondary | ICD-10-CM | POA: Diagnosis not present

## 2016-11-27 DIAGNOSIS — O9081 Anemia of the puerperium: Secondary | ICD-10-CM | POA: Diagnosis not present

## 2016-11-27 DIAGNOSIS — O253 Malnutrition in the puerperium: Secondary | ICD-10-CM | POA: Diagnosis not present

## 2016-11-27 DIAGNOSIS — G5720 Lesion of femoral nerve, unspecified lower limb: Secondary | ICD-10-CM | POA: Diagnosis not present

## 2016-11-27 DIAGNOSIS — S7411XA Injury of femoral nerve at hip and thigh level, right leg, initial encounter: Secondary | ICD-10-CM | POA: Diagnosis not present

## 2016-11-27 DIAGNOSIS — Z8759 Personal history of other complications of pregnancy, childbirth and the puerperium: Secondary | ICD-10-CM

## 2016-11-27 DIAGNOSIS — Z3A00 Weeks of gestation of pregnancy not specified: Secondary | ICD-10-CM | POA: Diagnosis not present

## 2016-11-27 DIAGNOSIS — S7412XA Injury of femoral nerve at hip and thigh level, left leg, initial encounter: Secondary | ICD-10-CM | POA: Diagnosis not present

## 2016-11-27 DIAGNOSIS — O133 Gestational [pregnancy-induced] hypertension without significant proteinuria, third trimester: Secondary | ICD-10-CM | POA: Diagnosis not present

## 2016-11-27 DIAGNOSIS — O1414 Severe pre-eclampsia complicating childbirth: Principal | ICD-10-CM | POA: Diagnosis present

## 2016-11-27 DIAGNOSIS — N179 Acute kidney failure, unspecified: Secondary | ICD-10-CM | POA: Diagnosis not present

## 2016-11-27 DIAGNOSIS — G5723 Lesion of femoral nerve, bilateral lower limbs: Secondary | ICD-10-CM | POA: Diagnosis not present

## 2016-11-27 DIAGNOSIS — S7410XS Injury of femoral nerve at hip and thigh level, unspecified leg, sequela: Secondary | ICD-10-CM | POA: Diagnosis not present

## 2016-11-27 DIAGNOSIS — R609 Edema, unspecified: Secondary | ICD-10-CM | POA: Diagnosis not present

## 2016-11-27 DIAGNOSIS — R531 Weakness: Secondary | ICD-10-CM | POA: Diagnosis not present

## 2016-11-27 DIAGNOSIS — R2689 Other abnormalities of gait and mobility: Secondary | ICD-10-CM | POA: Diagnosis not present

## 2016-11-27 DIAGNOSIS — O1003 Pre-existing essential hypertension complicating the puerperium: Secondary | ICD-10-CM | POA: Diagnosis not present

## 2016-11-27 DIAGNOSIS — D62 Acute posthemorrhagic anemia: Secondary | ICD-10-CM | POA: Diagnosis not present

## 2016-11-27 DIAGNOSIS — E46 Unspecified protein-calorie malnutrition: Secondary | ICD-10-CM | POA: Diagnosis not present

## 2016-11-27 DIAGNOSIS — S344XXD Injury of lumbosacral plexus, subsequent encounter: Secondary | ICD-10-CM | POA: Diagnosis not present

## 2016-11-27 DIAGNOSIS — D696 Thrombocytopenia, unspecified: Secondary | ICD-10-CM | POA: Diagnosis not present

## 2016-11-27 DIAGNOSIS — O039 Complete or unspecified spontaneous abortion without complication: Secondary | ICD-10-CM | POA: Diagnosis not present

## 2016-11-27 DIAGNOSIS — Z3A40 40 weeks gestation of pregnancy: Secondary | ICD-10-CM

## 2016-11-27 DIAGNOSIS — O904 Postpartum acute kidney failure: Secondary | ICD-10-CM | POA: Diagnosis not present

## 2016-11-27 DIAGNOSIS — X58XXXD Exposure to other specified factors, subsequent encounter: Secondary | ICD-10-CM | POA: Diagnosis not present

## 2016-11-27 DIAGNOSIS — O411230 Chorioamnionitis, third trimester, not applicable or unspecified: Secondary | ICD-10-CM | POA: Diagnosis not present

## 2016-11-27 DIAGNOSIS — Z3490 Encounter for supervision of normal pregnancy, unspecified, unspecified trimester: Secondary | ICD-10-CM

## 2016-11-27 DIAGNOSIS — R030 Elevated blood-pressure reading, without diagnosis of hypertension: Secondary | ICD-10-CM | POA: Diagnosis not present

## 2016-11-27 HISTORY — DX: Pain in unspecified knee: M25.569

## 2016-11-27 HISTORY — DX: Achilles tendinitis, unspecified leg: M76.60

## 2016-11-27 HISTORY — DX: Plantar fascial fibromatosis: M72.2

## 2016-11-27 LAB — COMPREHENSIVE METABOLIC PANEL
ALK PHOS: 167 U/L — AB (ref 38–126)
ALT: 24 U/L (ref 14–54)
ANION GAP: 8 (ref 5–15)
AST: 39 U/L (ref 15–41)
Albumin: 3.2 g/dL — ABNORMAL LOW (ref 3.5–5.0)
BILIRUBIN TOTAL: 0.8 mg/dL (ref 0.3–1.2)
BUN: 20 mg/dL (ref 6–20)
CALCIUM: 8.6 mg/dL — AB (ref 8.9–10.3)
CO2: 19 mmol/L — AB (ref 22–32)
Chloride: 109 mmol/L (ref 101–111)
Creatinine, Ser: 1.02 mg/dL — ABNORMAL HIGH (ref 0.44–1.00)
GFR calc non Af Amer: >60 mL/min (ref >60)
Glucose, Bld: 80 mg/dL (ref 65–99)
Potassium: 5 mmol/L (ref 3.5–5.1)
SODIUM: 136 mmol/L (ref 135–145)
TOTAL PROTEIN: 5.8 g/dL — AB (ref 6.5–8.1)

## 2016-11-27 LAB — TYPE AND SCREEN
ABO/RH(D): A NEG
Antibody Screen: NEGATIVE

## 2016-11-27 LAB — CBC
HCT: 33.7 % — ABNORMAL LOW (ref 36.0–46.0)
HEMOGLOBIN: 12.3 g/dL (ref 12.0–15.0)
MCH: 32.5 pg (ref 26.0–34.0)
MCHC: 36.5 g/dL — ABNORMAL HIGH (ref 30.0–36.0)
MCV: 88.9 fL (ref 78.0–100.0)
PLATELETS: 141 10*3/uL — AB (ref 150–400)
RBC: 3.79 MIL/uL — AB (ref 3.87–5.11)
RDW: 13.1 % (ref 11.5–15.5)
WBC: 8.9 10*3/uL (ref 4.0–10.5)

## 2016-11-27 LAB — PROTEIN / CREATININE RATIO, URINE
CREATININE, URINE: 30 mg/dL
Protein Creatinine Ratio: 5.93 mg/mg{Cre} — ABNORMAL HIGH (ref 0.00–0.15)
Total Protein, Urine: 178 mg/dL

## 2016-11-27 MED ORDER — ACETAMINOPHEN 325 MG PO TABS: 650.0000 mg | ORAL_TABLET | ORAL | Status: DC | PRN

## 2016-11-27 MED ORDER — ONDANSETRON HCL 4 MG/2ML IJ SOLN: 4.0000 mg | Freq: Four times a day (QID) | INTRAMUSCULAR | Status: DC | PRN

## 2016-11-27 MED ORDER — BUTORPHANOL TARTRATE 1 MG/ML IJ SOLN
1.0000 mg | INTRAMUSCULAR | Status: DC | PRN
  Administered 2016-11-27 – 2016-11-28 (×2): 1 mg via INTRAVENOUS
  Filled 2016-11-27 (×2): qty 1

## 2016-11-27 MED ORDER — LACTATED RINGERS IV SOLN
500.0000 mL | INTRAVENOUS | Status: DC | PRN
  Administered 2016-11-28: 1000 mL via INTRAVENOUS
  Administered 2016-11-28: 500 mL via INTRAVENOUS

## 2016-11-27 MED ORDER — LABETALOL HCL 5 MG/ML IV SOLN
20.0000 mg | INTRAVENOUS | Status: AC | PRN
  Administered 2016-11-27 (×3): 20 mg via INTRAVENOUS
  Filled 2016-11-27: qty 8
  Filled 2016-11-27 (×2): qty 4

## 2016-11-27 MED ORDER — MISOPROSTOL 25 MCG QUARTER TABLET
25.0000 ug | ORAL_TABLET | Freq: Three times a day (TID) | ORAL | Status: DC
  Filled 2016-11-27 (×3): qty 1

## 2016-11-27 MED ORDER — SOD CITRATE-CITRIC ACID 500-334 MG/5ML PO SOLN
30.0000 mL | ORAL | Status: DC | PRN
  Filled 2016-11-27: qty 15

## 2016-11-27 MED ORDER — EPHEDRINE 5 MG/ML INJ: 10.0000 mg | INTRAVENOUS | Status: DC | PRN

## 2016-11-27 MED ORDER — OXYCODONE-ACETAMINOPHEN 5-325 MG PO TABS: 2.0000 | ORAL_TABLET | ORAL | Status: DC | PRN

## 2016-11-27 MED ORDER — MISOPROSTOL 200 MCG PO TABS
50.0000 ug | ORAL_TABLET | Freq: Three times a day (TID) | ORAL | Status: DC
  Administered 2016-11-27: 50 ug via ORAL
  Filled 2016-11-27: qty 1

## 2016-11-27 MED ORDER — DIPHENHYDRAMINE HCL 50 MG/ML IJ SOLN: 12.5000 mg | INTRAMUSCULAR | Status: DC | PRN

## 2016-11-27 MED ORDER — OXYCODONE-ACETAMINOPHEN 5-325 MG PO TABS: 1.0000 | ORAL_TABLET | ORAL | Status: DC | PRN

## 2016-11-27 MED ORDER — HYDRALAZINE HCL 20 MG/ML IJ SOLN
10.0000 mg | Freq: Once | INTRAMUSCULAR | Status: AC | PRN
  Administered 2016-11-27: 10 mg via INTRAVENOUS
  Filled 2016-11-27: qty 1

## 2016-11-27 MED ORDER — LIDOCAINE HCL (PF) 1 % IJ SOLN
30.0000 mL | INTRAMUSCULAR | Status: DC | PRN
  Filled 2016-11-27: qty 30

## 2016-11-27 MED ORDER — LACTATED RINGERS IV SOLN
500.0000 mL | Freq: Once | INTRAVENOUS | Status: AC
  Administered 2016-11-28: 500 mL via INTRAVENOUS

## 2016-11-27 MED ORDER — OXYTOCIN BOLUS FROM INFUSION: 500.0000 mL | Freq: Once | INTRAVENOUS | Status: DC

## 2016-11-27 MED ORDER — OXYTOCIN 40 UNITS IN LACTATED RINGERS INFUSION - SIMPLE MED
2.5000 [IU]/h | INTRAVENOUS | Status: DC
  Administered 2016-11-28: 4 [IU]/h via INTRAVENOUS
  Administered 2016-11-28 (×2): 2.5 [IU]/h via INTRAVENOUS
  Filled 2016-11-27: qty 1000

## 2016-11-27 MED ORDER — FENTANYL 2.5 MCG/ML BUPIVACAINE 1/10 % EPIDURAL INFUSION (WH - ANES)
14.0000 mL/h | INTRAMUSCULAR | Status: DC | PRN
  Administered 2016-11-28: 14 mL/h via EPIDURAL
  Filled 2016-11-27: qty 100

## 2016-11-27 MED ORDER — PHENYLEPHRINE 40 MCG/ML (10ML) SYRINGE FOR IV PUSH (FOR BLOOD PRESSURE SUPPORT): 80.0000 ug | PREFILLED_SYRINGE | INTRAVENOUS | Status: DC | PRN

## 2016-11-27 MED ORDER — LACTATED RINGERS IV SOLN
INTRAVENOUS | Status: DC
  Administered 2016-11-28 (×3): via INTRAVENOUS

## 2016-11-27 MED ORDER — PHENYLEPHRINE 40 MCG/ML (10ML) SYRINGE FOR IV PUSH (FOR BLOOD PRESSURE SUPPORT)
80.0000 ug | PREFILLED_SYRINGE | INTRAVENOUS | Status: DC | PRN
  Filled 2016-11-27: qty 10

## 2016-11-27 NOTE — Anesthesia Pain Management Evaluation Note (Signed)
  CRNA Pain Management Visit Note  Patient: Brianna [name], 41 y.o., female  "Hello I am a member of the anesthesia team at [facility]. We have an anesthesia team available at all times to provide care throughout the hospital, including epidural management and anesthesia for C-section. I don't know your plan for the delivery whether it a natural birth, water birth, IV sedation, nitrous supplementation, doula or epidural, but we want to meet your pain goals."   1.Was your pain managed to your expectations on prior hospitalizations?   No prior hospitalizations  2.What is your expectation for pain management during this hospitalization?     Epidural  3.How can we help you reach that goal?   Record the patient's initial score and the patient's pain goal.   Pain: 2  Pain Goal: 7 [facility] wants you to be able to say your pain was always managed very well.  Jabier Mutton 11/27/2016

## 2016-11-27 NOTE — H&P (Addendum)
Brianna [name] is a 41 y.o. female G1P0 at 79 5/7 weeks (EDD 11/22/16 by known [facility] with IVF)   presenting for IOL with elevated BP of new onset and 2+ proteinuria.  Denies PIH sx of HA or other problems.  BP in office today 152/98.  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   OB History    Gravida Para Term Preterm AB Living   1             SAB TAB Ectopic Multiple Live Births                 Past Medical History:  Diagnosis Date  . Headache   . Newborn product of in vitro fertilization (IVF) pregnancy    Past Surgical History:  Procedure Laterality Date  . WISDOM TOOTH EXTRACTION    . WRIST FRACTURE SURGERY     r/wrist   Family History: family history includes Hyperlipidemia in her father and mother. Social History:  reports that  has never smoked. she has never used smokeless tobacco. She reports that she drinks alcohol. She reports that she does not use drugs.     Maternal Diabetes: No Genetic Screening: Normal Maternal Ultrasounds/Referrals: Normal Fetal Ultrasounds or other Referrals:  None Maternal Substance Abuse:  No Significant Maternal Medications:  None Significant Maternal Lab Results:  None Other Comments:  None  Review of Systems  Gastrointestinal: Negative for abdominal pain.  Neurological: Negative for headaches.   Maternal Medical History:  Contractions: Frequency: irregular.   Perceived severity is mild.    Fetal activity: Perceived fetal activity is normal.    Prenatal complications: PIH.   Prenatal Complications - Diabetes: none.      Last menstrual period 02/16/2016. Maternal Exam:  Uterine Assessment: Contraction strength is mild.  Contraction frequency is irregular.   Abdomen: Patient  reports no abdominal tenderness. Fetal presentation: vertex  Introitus: Normal vulva. Normal vagina.    Physical Exam  Constitutional: She is oriented to person, place, and time. She appears well-developed.  Cardiovascular: Normal rate and regular rhythm.  GI: Soft.  Genitourinary: Vagina normal.  Neurological: She is alert and oriented to person, place, and time.  Psychiatric: She has a normal mood and affect.    Prenatal labs: ABO, Rh: --/--/A NEG, A NEG (05/12 2151) Antibody: NEG (05/12 2151) Rubella: Immune (04/19 0000) RPR: Nonreactive (04/19 0000)  HBsAg: Negative (04/19 0000)  HIV: Non-reactive (04/19 0000)  GBS: Negative (10/09 0000)  Panorama low risk, female One hour GCT 85  Assessment/Plan: Pt for ripening and IOL given new onset elevated BP and proteinuria on dipstick.  Will check [place] labs on admission including a prot/creat ratio. Treat BP as needed.  Magnesium if severe features noted.  Cytotec then pitocin when able.    Logan Bores 11/27/2016, 3:48 PM

## 2016-11-27 NOTE — Progress Notes (Signed)
Patient ID: Brianna [name], female   DOB: 17-Jun-1975, 41 y.o.   MRN: [id number]   Reviewed IOL with pt.  Pt contracting too much for cytotec, so will place foley bulb.    AF 153-188/73-95, has been treated with labetalol x 1 gen NAD FHTs 135, mod var, + accels, category 1  toco q 2-48mn  Foley bulb place w/o diff/comp, to traction taped to R leg  Continue IOL, Cytotec if possible

## 2016-11-27 NOTE — Progress Notes (Signed)
Patient ID: Brianna [name], female   DOB: [birth year], 41 y.o.   MRN: [id number]   Pt w/o sx's, but BP elevated, has been treated on several occasions. Pt with some pain  Foley bulb intact  Will consider magnesium.    130's, mod var, category 1 toco 2-5mn

## 2016-11-28 ENCOUNTER — Encounter (HOSPITAL_COMMUNITY): Payer: Self-pay | Admitting: *Deleted

## 2016-11-28 ENCOUNTER — Inpatient Hospital Stay (HOSPITAL_COMMUNITY): Payer: BLUE CROSS/BLUE SHIELD | Admitting: Anesthesiology

## 2016-11-28 ENCOUNTER — Inpatient Hospital Stay (HOSPITAL_COMMUNITY): Payer: BLUE CROSS/BLUE SHIELD

## 2016-11-28 ENCOUNTER — Inpatient Hospital Stay (HOSPITAL_COMMUNITY): Admission: RE | Admit: 2016-11-28 | Payer: BLUE CROSS/BLUE SHIELD | Source: Ambulatory Visit

## 2016-11-28 ENCOUNTER — Encounter (HOSPITAL_COMMUNITY)
Admission: AD | Disposition: A | Discharge status: Rehabilitation Facility | Payer: Self-pay | Source: Ambulatory Visit | Attending: Obstetrics and Gynecology

## 2016-11-28 DIAGNOSIS — Z8759 Personal history of other complications of pregnancy, childbirth and the puerperium: Secondary | ICD-10-CM

## 2016-11-28 HISTORY — PX: DILATION AND CURETTAGE OF UTERUS: SHX78

## 2016-11-28 LAB — CBC
HCT: 34.4 % — ABNORMAL LOW (ref 36.0–46.0)
HCT: 33.4 % — ABNORMAL LOW (ref 36.0–46.0)
HCT: 31.2 % — ABNORMAL LOW (ref 36.0–46.0)
HEMOGLOBIN: 11.6 g/dL — AB (ref 12.0–15.0)
HEMOGLOBIN: 10.8 g/dL — AB (ref 12.0–15.0)
Hemoglobin: 12.2 g/dL (ref 12.0–15.0)
MCH: 31.9 pg (ref 26.0–34.0)
MCH: 32.2 pg (ref 26.0–34.0)
MCH: 31.7 pg (ref 26.0–34.0)
MCHC: 35.5 g/dL (ref 30.0–36.0)
MCHC: 34.7 g/dL (ref 30.0–36.0)
MCHC: 34.6 g/dL (ref 30.0–36.0)
MCV: 90.1 fL (ref 78.0–100.0)
MCV: 92.8 fL (ref 78.0–100.0)
MCV: 91.5 fL (ref 78.0–100.0)
PLATELETS: 140 10*3/uL — AB (ref 150–400)
PLATELETS: 113 10*3/uL — AB (ref 150–400)
Platelets: 138 10*3/uL — ABNORMAL LOW (ref 150–400)
RBC: 3.82 MIL/uL — AB (ref 3.87–5.11)
RBC: 3.6 MIL/uL — ABNORMAL LOW (ref 3.87–5.11)
RBC: 3.41 MIL/uL — AB (ref 3.87–5.11)
RDW: 13.4 % (ref 11.5–15.5)
RDW: 13.4 % (ref 11.5–15.5)
RDW: 13.3 % (ref 11.5–15.5)
WBC: 13.6 10*3/uL — ABNORMAL HIGH (ref 4.0–10.5)
WBC: 18.5 10*3/uL — ABNORMAL HIGH (ref 4.0–10.5)
WBC: 15.6 10*3/uL — AB (ref 4.0–10.5)

## 2016-11-28 LAB — COMPREHENSIVE METABOLIC PANEL
ALK PHOS: 147 U/L — AB (ref 38–126)
ALT: 19 U/L (ref 14–54)
ANION GAP: 9 (ref 5–15)
AST: 39 U/L (ref 15–41)
Albumin: 2.2 g/dL — ABNORMAL LOW (ref 3.5–5.0)
BUN: 24 mg/dL — ABNORMAL HIGH (ref 6–20)
CALCIUM: 7.2 mg/dL — AB (ref 8.9–10.3)
CO2: 17 mmol/L — AB (ref 22–32)
CREATININE: 1.84 mg/dL — AB (ref 0.44–1.00)
Chloride: 100 mmol/L — ABNORMAL LOW (ref 101–111)
GFR, EST AFRICAN AMERICAN: 38 mL/min — AB (ref >60)
GFR, EST NON AFRICAN AMERICAN: 33 mL/min — AB (ref >60)
Glucose, Bld: 88 mg/dL (ref 65–99)
Potassium: 5.1 mmol/L (ref 3.5–5.1)
SODIUM: 126 mmol/L — AB (ref 135–145)
TOTAL PROTEIN: 4.4 g/dL — AB (ref 6.5–8.1)
Total Bilirubin: 0.7 mg/dL (ref 0.3–1.2)

## 2016-11-28 LAB — RPR: RPR: NONREACTIVE

## 2016-11-28 SURGERY — DILATION AND CURETTAGE
Anesthesia: Epidural

## 2016-11-28 MED ORDER — MAGNESIUM SULFATE BOLUS VIA INFUSION
4.0000 g | Freq: Once | INTRAVENOUS | Status: AC
  Administered 2016-11-28: 4 g via INTRAVENOUS
  Filled 2016-11-28: qty 500

## 2016-11-28 MED ORDER — SODIUM BICARBONATE 8.4 % IV SOLN
INTRAVENOUS | Status: DC | PRN
  Administered 2016-11-28: 3 mL via EPIDURAL
  Administered 2016-11-28 (×2): 5 mL via EPIDURAL
  Administered 2016-11-28: 2 mL via EPIDURAL
  Administered 2016-11-28: 5 mL via EPIDURAL

## 2016-11-28 MED ORDER — TETANUS-DIPHTH-ACELL PERTUSSIS 5-2.5-18.5 LF-MCG/0.5 IM SUSP: 0.5000 mL | Freq: Once | INTRAMUSCULAR | Status: DC

## 2016-11-28 MED ORDER — ONDANSETRON HCL 4 MG/2ML IJ SOLN
INTRAMUSCULAR | Status: AC
  Filled 2016-11-28: qty 2

## 2016-11-28 MED ORDER — EPHEDRINE 5 MG/ML INJ
INTRAVENOUS | Status: AC
  Filled 2016-11-28: qty 10

## 2016-11-28 MED ORDER — IBUPROFEN 600 MG PO TABS
600.0000 mg | ORAL_TABLET | Freq: Four times a day (QID) | ORAL | Status: DC
  Administered 2016-11-28 – 2016-11-29 (×2): 600 mg via ORAL
  Filled 2016-11-28 (×3): qty 1

## 2016-11-28 MED ORDER — METOCLOPRAMIDE HCL 5 MG/ML IJ SOLN
INTRAMUSCULAR | Status: AC
  Filled 2016-11-28: qty 2

## 2016-11-28 MED ORDER — SODIUM BICARBONATE 8.4 % IV SOLN
INTRAVENOUS | Status: AC
  Filled 2016-11-28: qty 50

## 2016-11-28 MED ORDER — OXYCODONE HCL 5 MG PO TABS: 10.0000 mg | ORAL_TABLET | ORAL | Status: DC | PRN

## 2016-11-28 MED ORDER — SODIUM CHLORIDE 0.9 % IV SOLN
3.0000 g | Freq: Once | INTRAVENOUS | Status: AC
  Administered 2016-11-28: 3 g via INTRAVENOUS
  Filled 2016-11-28: qty 3

## 2016-11-28 MED ORDER — ONDANSETRON HCL 4 MG/2ML IJ SOLN
INTRAMUSCULAR | Status: DC | PRN
  Administered 2016-11-28: 4 mg via INTRAVENOUS

## 2016-11-28 MED ORDER — LIDOCAINE HCL (PF) 1 % IJ SOLN
INTRAMUSCULAR | Status: DC | PRN
  Administered 2016-11-28: 8 mL
  Administered 2016-11-28: 5 mL

## 2016-11-28 MED ORDER — ONDANSETRON HCL 4 MG/2ML IJ SOLN: 4.0000 mg | INTRAMUSCULAR | Status: DC | PRN

## 2016-11-28 MED ORDER — DEXAMETHASONE SODIUM PHOSPHATE 10 MG/ML IJ SOLN
INTRAMUSCULAR | Status: AC
  Filled 2016-11-28: qty 1

## 2016-11-28 MED ORDER — OXYTOCIN 10 UNIT/ML IJ SOLN
INTRAMUSCULAR | Status: DC | PRN
  Administered 2016-11-28: 20 [IU] via INTRAMUSCULAR

## 2016-11-28 MED ORDER — BENZOCAINE-MENTHOL 20-0.5 % EX AERO: 1.0000 "application " | INHALATION_SPRAY | CUTANEOUS | Status: DC | PRN

## 2016-11-28 MED ORDER — ACETAMINOPHEN 325 MG PO TABS
650.0000 mg | ORAL_TABLET | ORAL | Status: DC | PRN
  Administered 2016-11-30: 650 mg via ORAL
  Filled 2016-11-28 (×2): qty 2

## 2016-11-28 MED ORDER — MIDAZOLAM HCL 2 MG/2ML IJ SOLN
INTRAMUSCULAR | Status: DC | PRN
  Administered 2016-11-28 (×2): 1 mg via INTRAVENOUS

## 2016-11-28 MED ORDER — TERBUTALINE SULFATE 1 MG/ML IJ SOLN: 0.2500 mg | Freq: Once | INTRAMUSCULAR | Status: DC | PRN

## 2016-11-28 MED ORDER — OXYTOCIN 10 UNIT/ML IJ SOLN
INTRAMUSCULAR | Status: AC
  Filled 2016-11-28: qty 2

## 2016-11-28 MED ORDER — DIPHENHYDRAMINE HCL 25 MG PO CAPS: 25.0000 mg | ORAL_CAPSULE | Freq: Four times a day (QID) | ORAL | Status: DC | PRN

## 2016-11-28 MED ORDER — EPHEDRINE SULFATE 50 MG/ML IJ SOLN
INTRAMUSCULAR | Status: DC | PRN
  Administered 2016-11-28 (×4): 10 mg via INTRAVENOUS

## 2016-11-28 MED ORDER — FENTANYL CITRATE (PF) 100 MCG/2ML IJ SOLN
INTRAMUSCULAR | Status: DC | PRN
  Administered 2016-11-28: 25 ug via INTRAVENOUS
  Administered 2016-11-28: 50 ug via INTRAVENOUS
  Administered 2016-11-28: 25 ug via INTRAVENOUS

## 2016-11-28 MED ORDER — DEXAMETHASONE SODIUM PHOSPHATE 10 MG/ML IJ SOLN
INTRAMUSCULAR | Status: DC | PRN
  Administered 2016-11-28: 10 mg via INTRAVENOUS

## 2016-11-28 MED ORDER — MISOPROSTOL 200 MCG PO TABS
ORAL_TABLET | ORAL | Status: AC
  Filled 2016-11-28: qty 3

## 2016-11-28 MED ORDER — MIDAZOLAM HCL 2 MG/2ML IJ SOLN
INTRAMUSCULAR | Status: AC
  Filled 2016-11-28: qty 2

## 2016-11-28 MED ORDER — OXYTOCIN 10 UNIT/ML IJ SOLN
INTRAMUSCULAR | Status: AC
  Filled 2016-11-28: qty 4

## 2016-11-28 MED ORDER — LIDOCAINE-EPINEPHRINE (PF) 2 %-1:200000 IJ SOLN
INTRAMUSCULAR | Status: AC
  Filled 2016-11-28: qty 20

## 2016-11-28 MED ORDER — FENTANYL CITRATE (PF) 100 MCG/2ML IJ SOLN
INTRAMUSCULAR | Status: AC
  Filled 2016-11-28: qty 2

## 2016-11-28 MED ORDER — SIMETHICONE 80 MG PO CHEW: 80.0000 mg | CHEWABLE_TABLET | ORAL | Status: DC | PRN

## 2016-11-28 MED ORDER — MORPHINE SULFATE (PF) 0.5 MG/ML IJ SOLN
INTRAMUSCULAR | Status: AC
  Filled 2016-11-28: qty 10

## 2016-11-28 MED ORDER — PRENATAL MULTIVITAMIN CH
1.0000 | ORAL_TABLET | Freq: Every day | ORAL | Status: DC
  Administered 2016-11-29 – 2016-12-03 (×4): 1 via ORAL
  Filled 2016-11-28 (×5): qty 1

## 2016-11-28 MED ORDER — ONDANSETRON HCL 4 MG PO TABS: 4.0000 mg | ORAL_TABLET | ORAL | Status: DC | PRN

## 2016-11-28 MED ORDER — MAGNESIUM SULFATE 40 G IN LACTATED RINGERS - SIMPLE
2.0000 g/h | INTRAVENOUS | Status: DC
  Administered 2016-11-29: 2 g/h via INTRAVENOUS
  Filled 2016-11-28: qty 500
  Filled 2016-11-28: qty 40

## 2016-11-28 MED ORDER — MISOPROSTOL 200 MCG PO TABS
600.0000 ug | ORAL_TABLET | Freq: Once | ORAL | Status: AC
  Administered 2016-11-28: 600 ug via RECTAL

## 2016-11-28 MED ORDER — OXYCODONE HCL 5 MG PO TABS: 5.0000 mg | ORAL_TABLET | ORAL | Status: DC | PRN

## 2016-11-28 MED ORDER — WITCH HAZEL-GLYCERIN EX PADS: 1.0000 "application " | MEDICATED_PAD | CUTANEOUS | Status: DC | PRN

## 2016-11-28 MED ORDER — PHENYLEPHRINE 40 MCG/ML (10ML) SYRINGE FOR IV PUSH (FOR BLOOD PRESSURE SUPPORT)
PREFILLED_SYRINGE | INTRAVENOUS | Status: AC
  Filled 2016-11-28: qty 10

## 2016-11-28 MED ORDER — ZOLPIDEM TARTRATE 5 MG PO TABS: 5.0000 mg | ORAL_TABLET | Freq: Every evening | ORAL | Status: DC | PRN

## 2016-11-28 MED ORDER — SCOPOLAMINE 1 MG/3DAYS TD PT72
MEDICATED_PATCH | TRANSDERMAL | Status: AC
  Filled 2016-11-28: qty 1

## 2016-11-28 MED ORDER — LIDOCAINE HCL (CARDIAC) 20 MG/ML IV SOLN
INTRAVENOUS | Status: AC
  Filled 2016-11-28: qty 5

## 2016-11-28 MED ORDER — SOD CITRATE-CITRIC ACID 500-334 MG/5ML PO SOLN
30.0000 mL | Freq: Once | ORAL | Status: AC
  Administered 2016-11-28: 30 mL via ORAL
  Filled 2016-11-28: qty 15

## 2016-11-28 MED ORDER — PROPOFOL 10 MG/ML IV BOLUS
INTRAVENOUS | Status: AC
  Filled 2016-11-28: qty 20

## 2016-11-28 MED ORDER — SODIUM CHLORIDE 0.9 % IV SOLN
3.0000 g | Freq: Four times a day (QID) | INTRAVENOUS | Status: AC
  Administered 2016-11-28 – 2016-11-29 (×2): 3 g via INTRAVENOUS
  Filled 2016-11-28 (×2): qty 3

## 2016-11-28 MED ORDER — COCONUT OIL OIL
1.0000 "application " | TOPICAL_OIL | Status: DC | PRN
  Administered 2016-11-29: 1 via TOPICAL
  Filled 2016-11-28: qty 120

## 2016-11-28 MED ORDER — DEXAMETHASONE SODIUM PHOSPHATE 4 MG/ML IJ SOLN
INTRAMUSCULAR | Status: AC
  Filled 2016-11-28: qty 1

## 2016-11-28 MED ORDER — METOCLOPRAMIDE HCL 5 MG/ML IJ SOLN
INTRAMUSCULAR | Status: DC | PRN
  Administered 2016-11-28 (×2): 5 mg via INTRAVENOUS

## 2016-11-28 MED ORDER — OXYTOCIN 40 UNITS IN LACTATED RINGERS INFUSION - SIMPLE MED
1.0000 m[IU]/min | INTRAVENOUS | Status: DC
  Administered 2016-11-28: 1 m[IU]/min via INTRAVENOUS

## 2016-11-28 MED ORDER — DIBUCAINE 1 % RE OINT: 1.0000 "application " | TOPICAL_OINTMENT | RECTAL | Status: DC | PRN

## 2016-11-28 MED ORDER — SENNOSIDES-DOCUSATE SODIUM 8.6-50 MG PO TABS
2.0000 | ORAL_TABLET | ORAL | Status: DC
  Administered 2016-11-28: 2 via ORAL
  Filled 2016-11-28: qty 2

## 2016-11-28 MED ORDER — LACTATED RINGERS IV SOLN
INTRAVENOUS | Status: DC
  Administered 2016-11-28: 09:00:00 via INTRAUTERINE

## 2016-11-28 SURGICAL SUPPLY — 12 items
CATH ROBINSON RED A/P 16FR (CATHETERS) ×2 IMPLANT
CLOTH BEACON ORANGE TIMEOUT ST (SAFETY) ×2 IMPLANT
CONTAINER PREFILL 10% NBF 60ML (FORM) ×4 IMPLANT
GLOVE BIO SURGEON STRL SZ 6.5 (GLOVE) ×2 IMPLANT
GLOVE BIOGEL PI IND STRL 7.0 (GLOVE) ×1 IMPLANT
GLOVE BIOGEL PI INDICATOR 7.0 (GLOVE) ×1
GOWN STRL REUS W/TWL LRG LVL3 (GOWN DISPOSABLE) ×4 IMPLANT
PACK VAGINAL MINOR WOMEN LF (CUSTOM PROCEDURE TRAY) ×2 IMPLANT
PAD OB MATERNITY 4.3X12.25 (PERSONAL CARE ITEMS) ×2 IMPLANT
PAD PREP 24X48 CUFFED NSTRL (MISCELLANEOUS) ×2 IMPLANT
TOWEL OR 17X24 6PK STRL BLUE (TOWEL DISPOSABLE) ×4 IMPLANT
WATER STERILE IRR 1000ML POUR (IV SOLUTION) ×2 IMPLANT

## 2016-11-28 NOTE — Progress Notes (Signed)
Patient ID: Brianna [name], female   DOB: 25-Jul-1975, 41 y.o.   MRN: [id number] Pt continued to have deep variables on left side even with amnioinfusion Just moved to semi-fowler position  Cervix c/9+/0  Pt counseled that if we cannot correct the Category 2 tracing we may need to proceed with c-section. Risks and benefits of c-section reviewed with bleeding, infection and possible damage to bowel and bladder.  The entire procedure was reviewed.    FHR currently improved in semi-fowlers with only mild variable decels and good variability.  Will try to get baby to a low station before pushing if baby will tolerate. Pt agrees with plan.

## 2016-11-28 NOTE — Op Note (Addendum)
Operative Note    Preoperative Diagnosis Retained placenta tissue s/p vacuum assisted delivery  Postoperative Diagnosis same  Procedure Ultrasound guided curettage of uterus  Surgeon Paula Compton, MD  Anesthesia Epidural and MAC  Fluids: EBL 157m UOP 2539mconcentrated IVF 180055mR  Findings The uterine fundus was enlarged to about 2-31-1AFove the umbilicus but there was minimal bleeding and not much clot noted in the fundus. At the very superior aspect of the fundus, a 5cm fragment of placental tissue was noted.    Specimen Fragments of placental tissue  Procedure Note Pt was taken to the operating room and epidural boosted.  She was also given light sedation.  With her feet in stirrups, attention was turned vaginally and a speculum placed within the vagina to attempt to visualize the cervix.  The cervix was distal in the vagina, but the anterior lip was grasped with a ring forcep.  The [place] was then placed on the abdomen to visualize the fundus.  Under direct visualization, the banjo curette was just able reach the inferior aspect of the tissue, but the entire length of the instrument was in the vagina to do so.  With multiple passes to the area, about 3cm of tissue removed in multiple small pieces.  An attempt was made to introduce suction to the area, but the largest cannula available was the 14m12md it was too short to reach the top of the fundus.  When as much tissue as could be removed had been obtained, the area of retained tissue had reduced from 5+cm to 3+cm. The patient never had any excessive bleeding and the LUS of the fundus remained tightly contracted down.  All instrument and sponge counts were correct and the patient was taken to PACU in good condition. I discussed with the patient that I could not reach all the tissue and it was possible she would need a future D&C when the uterus involutes to a size that the instruments can reach.   Pt agreeable, anxious  to return to baby and eat!   Will continue magnesium and watch UOP, labs closely.  Unasyn x 2 doses for instrumentation of uterus

## 2016-11-28 NOTE — Progress Notes (Signed)
Patient ID: Brianna [name], female   DOB: February 16, 1975, 41 y.o.   MRN: [id number] Late entry note:  Pt continued to have deep variable decelerations but reached complete dilation.  We decided to proceed with pushing with me at bedside.  Pt pushed began pushing and made good progress bringing the vertex to a +2 station.  The FHR throughout pushing had variable decelerations, but did improve quickly back to baseline and retained good variability.  When the vertex was felt low enough to attempt vacuum assisted delivery, the patient and husband were counseled on the risks and benefits including cephalohematoma and agreed to proceed.   See delivery note

## 2016-11-28 NOTE — Anesthesia Postprocedure Evaluation (Signed)
Anesthesia Post Note  Patient: [name]  Procedure(s) Performed: DILATATION AND CURETTAGE (N/A )     Patient location during evaluation: PACU Anesthesia Type: Epidural Level of consciousness: awake Pain management: pain level controlled Vital Signs Assessment: post-procedure vital signs reviewed and stable Respiratory status: spontaneous breathing Cardiovascular status: stable Postop Assessment: no headache, no backache, epidural receding, no apparent nausea or vomiting and patient able to bend at knees Anesthetic complications: no    Last Vitals:  Vitals:   11/28/16 1945 11/28/16 2000  BP: 115/62   Pulse: 71 75  Resp: 12 16  Temp:    SpO2: 96% 98%    Last Pain:  Vitals:   11/28/16 1928  TempSrc: Oral  PainSc:    Pain Goal: Patients Stated Pain Goal: 4 (11/28/16 0140)               Burt

## 2016-11-28 NOTE — Progress Notes (Signed)
Patient ID: Brianna [name], female   DOB: January 13, 1976, 41 y.o.   MRN: [id number]   Pt progressing in labor.  Some repetitive variables.  Improved with position change.  SVE 6.7, + scalp stim FHTs 135-140, mod var, + accels toco q 2-45mn  D/w pt poss placement of IUPC, pt would like to defer Contractions after 1 dose of cytotec and foley bulb.

## 2016-11-28 NOTE — Anesthesia Preprocedure Evaluation (Signed)
Anesthesia Evaluation  Patient identified by MRN, date of birth, ID band Patient awake    Reviewed: Allergy & Precautions, H&P , NPO status , Patient's Chart, lab work & pertinent test results  History of Anesthesia Complications Negative for: history of anesthetic complications  Airway Mallampati: II  TM Distance: >3 FB Neck ROM: full    Dental no notable dental hx. (+) Teeth Intact   Pulmonary neg pulmonary ROS,    Pulmonary exam normal breath sounds clear to auscultation       Cardiovascular hypertension, Normal cardiovascular exam Rhythm:regular Rate:Normal     Neuro/Psych negative neurological ROS  negative psych ROS   GI/Hepatic negative GI ROS, Neg liver ROS,   Endo/Other  negative endocrine ROS  Renal/GU negative Renal ROS  negative genitourinary   Musculoskeletal   Abdominal   Peds  Hematology negative hematology ROS (+)   Anesthesia Other Findings   Reproductive/Obstetrics                             Anesthesia Physical  Anesthesia Plan  ASA: II  Anesthesia Plan: Epidural   Post-op Pain Management:    Induction:   PONV Risk Score and Plan: 3 and Ondansetron, Scopolamine patch - Pre-op and Treatment may vary due to age or medical condition  Airway Management Planned:   Additional Equipment:   Intra-op Plan:   Post-operative Plan:   Informed Consent: I have reviewed the patients History and Physical, chart, labs and discussed the procedure including the risks, benefits and alternatives for the proposed anesthesia with the patient or authorized representative who has indicated his/her understanding and acceptance.   Dental advisory given  Plan Discussed with: CRNA  Anesthesia Plan Comments:         Anesthesia Quick Evaluation

## 2016-11-28 NOTE — Anesthesia Procedure Notes (Signed)
Epidural Patient location during procedure: OB  Staffing Anesthesiologist: Montez Hageman, MD Performed: anesthesiologist   Preanesthetic Checklist Completed: patient identified, site marked, surgical consent, pre-op evaluation, timeout performed, IV checked, risks and benefits discussed and monitors and equipment checked  Epidural Patient position: sitting Prep: DuraPrep Patient monitoring: heart rate, continuous pulse ox and blood pressure Approach: right paramedian Location: L3-L4 Injection technique: LOR saline  Needle:  Needle type: Tuohy  Needle gauge: 17 G Needle length: 9 cm and 9 Needle insertion depth: 6 cm Catheter type: closed end flexible Catheter size: 20 Guage Catheter at skin depth: 10 cm Test dose: negative  Assessment Events: blood not aspirated, injection not painful, no injection resistance, negative IV test and no paresthesia  Additional Notes Patient identified. Risks/Benefits/Options discussed with patient including but not limited to bleeding, infection, nerve damage, paralysis, failed block, incomplete pain control, headache, blood pressure changes, nausea, vomiting, reactions to medication both or allergic, itching and postpartum back pain. Confirmed with bedside nurse the patient's most recent platelet count. Confirmed with patient that they are not currently taking any anticoagulation, have any bleeding history or any family history of bleeding disorders. Patient expressed understanding and wished to proceed. All questions were answered. Sterile technique was used throughout the entire procedure. Please see nursing notes for vital signs. Test dose was given through epidural needle and negative prior to continuing to dose epidural or start infusion. Warning signs of high block given to the patient including shortness of breath, tingling/numbness in hands, complete motor block, or any concerning symptoms with instructions to call for help. Patient was given  instructions on fall risk and not to get out of bed. All questions and concerns addressed with instructions to call with any issues.

## 2016-11-28 NOTE — Anesthesia Preprocedure Evaluation (Signed)
Anesthesia Evaluation  Patient identified by MRN, date of birth, ID band Patient awake    Reviewed: Allergy & Precautions, H&P , NPO status , Patient's Chart, lab work & pertinent test results  History of Anesthesia Complications Negative for: history of anesthetic complications  Airway Mallampati: II  TM Distance: >3 FB Neck ROM: full    Dental no notable dental hx. (+) Teeth Intact   Pulmonary neg pulmonary ROS,    Pulmonary exam normal breath sounds clear to auscultation       Cardiovascular hypertension, Normal cardiovascular exam Rhythm:regular Rate:Normal     Neuro/Psych negative neurological ROS  negative psych ROS   GI/Hepatic negative GI ROS, Neg liver ROS,   Endo/Other  negative endocrine ROS  Renal/GU negative Renal ROS  negative genitourinary   Musculoskeletal   Abdominal   Peds  Hematology negative hematology ROS (+)   Anesthesia Other Findings   Reproductive/Obstetrics (+) Pregnancy                             Anesthesia Physical Anesthesia Plan  ASA: II  Anesthesia Plan: Epidural   Post-op Pain Management:    Induction:   PONV Risk Score and Plan:   Airway Management Planned:   Additional Equipment:   Intra-op Plan:   Post-operative Plan:   Informed Consent: I have reviewed the patients History and Physical, chart, labs and discussed the procedure including the risks, benefits and alternatives for the proposed anesthesia with the patient or authorized representative who has indicated his/her understanding and acceptance.     Plan Discussed with:   Anesthesia Plan Comments:         Anesthesia Quick Evaluation

## 2016-11-28 NOTE — Transfer of Care (Signed)
Immediate Anesthesia Transfer of Care Note  Patient: [name]  Procedure(s) Performed: DILATATION AND CURETTAGE (N/A )  Patient Location: PACU  Anesthesia Type:Epidural  Level of Consciousness: awake, alert  and oriented  Airway & Oxygen Therapy: Patient Spontanous Breathing  Post-op Assessment: Report given to RN and Post -op Vital signs reviewed and stable  Post vital signs: Reviewed and stable  Last Vitals:  Vitals:   11/28/16 1601 11/28/16 1631  BP: (!) 153/79 (!) 153/70  Pulse: 90 86  Resp:    Temp:    SpO2:      Last Pain:  Vitals:   11/28/16 0901  TempSrc:   PainSc: 0-No pain      Patients Stated Pain Goal: 4 (67/70/34 0352)  Complications: No apparent anesthesia complications

## 2016-11-28 NOTE — Progress Notes (Addendum)
Patient ID: Lebron Quam, female   DOB: 1975-07-21, 41 y.o.   MRN: [id number] Pt has been resting and spending time with the baby.  Remained NPO because of possible retained placental tissue.  Fundal massage does not elicit any clots but fundal height is inappropriately high above the umbilicus.    D/w pt options and recommend we go to OR and do an exam under MAC and boost epidural to see if placental tissue is fundal and can be evacuated .  Pt agreeable.    Labs were stable with hgb 11.6 platelets 138 Creatinine did bump from 1.02 to 1.84  UOP 369m since delivery  BP now back to 150's/70's Magnesium running for seizure prophylaxis since 2pm.   Counseled patient on risks and benefits of D&C and she is ready to proceed

## 2016-11-28 NOTE — Progress Notes (Signed)
Patient ID: Brianna [name], female   DOB: [birth year], 41 y.o.   MRN: [id number] Late entry note  Pt comfortable since receiving epidural.  Still on no augmentation since initial cytotec dose Denies HA or PIH sx  FHR category 2 with intermittent deep variables.   Good variability and some accels  Cervix c/8-9/0  D/w pt that she needed the IUPC due to the deep variables as well as an FSE.  Since hopefully nearing delivery, appropriate to continue laboring with close following of fetal status.  Will try amnioinfusion for variables.  Augment with pitocin if not adequate as tracing allows, but since making progress will try to hold off on that.

## 2016-11-29 ENCOUNTER — Inpatient Hospital Stay (HOSPITAL_COMMUNITY): Payer: BLUE CROSS/BLUE SHIELD

## 2016-11-29 DIAGNOSIS — G5723 Lesion of femoral nerve, bilateral lower limbs: Secondary | ICD-10-CM

## 2016-11-29 LAB — COMPREHENSIVE METABOLIC PANEL
ALT: 19 U/L (ref 14–54)
AST: 39 U/L (ref 15–41)
Albumin: 2.1 g/dL — ABNORMAL LOW (ref 3.5–5.0)
Alkaline Phosphatase: 114 U/L (ref 38–126)
Anion gap: 6 (ref 5–15)
BUN: 23 mg/dL — AB (ref 6–20)
CHLORIDE: 105 mmol/L (ref 101–111)
CO2: 20 mmol/L — AB (ref 22–32)
CREATININE: 1.36 mg/dL — AB (ref 0.44–1.00)
Calcium: 6.9 mg/dL — ABNORMAL LOW (ref 8.9–10.3)
GFR calc non Af Amer: 47 mL/min — ABNORMAL LOW (ref >60)
GFR, EST AFRICAN AMERICAN: 55 mL/min — AB (ref >60)
Glucose, Bld: 107 mg/dL — ABNORMAL HIGH (ref 65–99)
Potassium: 4.5 mmol/L (ref 3.5–5.1)
SODIUM: 131 mmol/L — AB (ref 135–145)
Total Bilirubin: 0.4 mg/dL (ref 0.3–1.2)
Total Protein: 4.6 g/dL — ABNORMAL LOW (ref 6.5–8.1)

## 2016-11-29 LAB — CBC WITH DIFFERENTIAL/PLATELET
Basophils Absolute: 0 10*3/uL (ref 0.0–0.1)
Basophils Relative: 0 %
EOS ABS: 0.1 10*3/uL (ref 0.0–0.7)
EOS PCT: 1 %
HCT: 25.3 % — ABNORMAL LOW (ref 36.0–46.0)
HEMOGLOBIN: 8.9 g/dL — AB (ref 12.0–15.0)
LYMPHS ABS: 1.9 10*3/uL (ref 0.7–4.0)
Lymphocytes Relative: 15 %
MCH: 32.6 pg (ref 26.0–34.0)
MCHC: 35.2 g/dL (ref 30.0–36.0)
MCV: 92.7 fL (ref 78.0–100.0)
MONOS PCT: 4 %
Monocytes Absolute: 0.5 10*3/uL (ref 0.1–1.0)
NEUTROS PCT: 80 %
Neutro Abs: 10.8 10*3/uL — ABNORMAL HIGH (ref 1.7–7.7)
Platelets: 122 10*3/uL — ABNORMAL LOW (ref 150–400)
RBC: 2.73 MIL/uL — ABNORMAL LOW (ref 3.87–5.11)
RDW: 13.6 % (ref 11.5–15.5)
WBC: 13.2 10*3/uL — ABNORMAL HIGH (ref 4.0–10.5)

## 2016-11-29 LAB — CBC
HEMATOCRIT: 26.9 % — AB (ref 36.0–46.0)
HEMOGLOBIN: 9.5 g/dL — AB (ref 12.0–15.0)
MCH: 32.2 pg (ref 26.0–34.0)
MCHC: 35.3 g/dL (ref 30.0–36.0)
MCV: 91.2 fL (ref 78.0–100.0)
Platelets: 101 10*3/uL — ABNORMAL LOW (ref 150–400)
RBC: 2.95 MIL/uL — ABNORMAL LOW (ref 3.87–5.11)
RDW: 13.3 % (ref 11.5–15.5)
WBC: 14.4 10*3/uL — ABNORMAL HIGH (ref 4.0–10.5)

## 2016-11-29 NOTE — Progress Notes (Signed)
CTSP for persisttent humbness s/p epidural.  Epidural cath still in. Last platelet count 101k. Had a D&E yesterday for retained products. This AM c/o sensory changes to upper legs and weakness. Foley removed. After several hours unable to void. Had bladder incontinence after sitting up in bed. Foley replaced.  On exam: Paresthesia present bilaterally on inner thighs and over kneecaps. No motor weakness on exam.  A/P:  Suspect peripheral nerve injury from vacuum assisted vaginal delivery. Anterior femoral cutaneous nerve or L2-3. However, cannot rule out epidural etiology of symptoms. A high level of vigilance is required for possible evolving epidural hematoma. Discussed extensively with patient and nurse. She will report any worsening motor or sensory changes immediately for assesment.  Will reassess tonight.   [employer]

## 2016-11-29 NOTE — Progress Notes (Signed)
Turned magnesium off around 1300, feeling better, moving legs better Afeb, VSS, BP 140-150/60-80 Will continue routine care, recheck CBC to follow platelets and make sure they have reached their nadir

## 2016-11-29 NOTE — Progress Notes (Signed)
Follow-up note for prolonged leg weakness following epidural placement and prolonged vacuum assisted vaginal delivery. Examined pt upon coming on call and receiving check out at approximately 18:55. Pt seen and examined. States that her numbness in her legs has gotten better but is still persistent and states persistent weakness in bilateral thighs. Denies pain in her back and no fevers or chills. On exam pt has 3/5 strength in bilateral quadriceps muscles with bilateral sensory changes in femoral nerve distribution on anterior thigh and medial shin to medial malleolus. All other motor components 5/5 strength. Epidural site is dry, non-erythemtous, no drainage and non-tender. Discussed case with Dr. Malen Gauze (Neurology). Pt still with epidural catheter in place due to pre-eclampsia and low platelets post-delivery. DIC panel pending at time of discussion. Due to possibility of resolving coagulopathy (and inability to pull epidural immediately) and no MRI on site we discussed obtaining CT lumbar spine for rule out hematoma, despite somewhat inferior imaging quality. Arranged with radiology department on site and pt will have stat CT lumbar spine. Will continue to follow.  Deatra Canter, MD

## 2016-11-29 NOTE — Progress Notes (Signed)
Pt cath taken out. Pt  dangled and sat at edge of bed. Pt said she still feel heaviness of  Left leg and not ready to ambulate. Pt was helped back in bed.  Lab call for cbc and cmet to be drawn. Pt continue to have her epidural cath in place awaiting platelet count before d/c.

## 2016-11-29 NOTE — Progress Notes (Signed)
RN assisted patient to Brianna [name], pt was able to pull herself to standing position with assistance, lower extremities remain weak, however patient able to stand for approx 5 minutes with the assistance of the Brianna [name]. Pt assisted back to bed.

## 2016-11-29 NOTE — Progress Notes (Signed)
Post Partum Day #1, POD #1 D&C Subjective: voiding, tolerating PO and legs still numb  Objective: Blood pressure (!) 148/83, pulse 77, temperature 98.3 F (36.8 C), temperature source Oral, resp. rate 18, weight 180 lb (81.6 kg), last menstrual period 02/16/2016, SpO2 99 %, unknown if currently breastfeeding.  Physical Exam:  General: alert Lochia: appropriate Uterine Fundus: firm, U+2 DTR 1/4  Recent Labs    11/28/16 2151 11/29/16 0720  HGB 10.8* 9.5*  HCT 31.2* 26.9*    Assessment/Plan: Doing well with preeclampsia, BP stable, starting to diurese.  CBC stable, CMP pending.  Will continue magnesium for now, reassess at lunch to stop it.   LOS: 2 days   Clarene Duke 11/29/2016, 8:26 AM

## 2016-11-29 NOTE — Lactation Note (Signed)
This note was copied from a baby's chart. Lactation Consultation Note Mom having PIH on mag. IV. Baby 33 hours old for consult. Baby on Lt. Breast BF in cradle position w/good latch. Mom denies pain.  RN reported that baby gags on the Rt. Nipple. Mom has large round knobby everted Lt. Nipple, baby BF well. Noted breast compression.  Rt. Nipple long shorter shaft everted,knobby as well. RN stated baby has a harder time latching to Rt. Nipple d/t larger. Hand express colostrum. Gave bullet to collect from. Hand pump given to post pump for stimulation or obtain colostrum to give as supplement. Mom encouraged to feed baby 8-12 times/24 hours and with feeding cues. Newborn feeding habits reviewed and behavior.  Mom has a lot of edema to entire  Body. Breast are heavy, hand expresses easily, LC feels some may be edema. Encouraged mom to assess breast before and after for transfer, especially since nipples are big. Noted when baby came off of Lt. Breast, only felt slightly softer. No swallows heard.  Encouraged to call for assistance if needed.   [place] brochure given w/resources, support groups and [facility] services. Patient Name: Brianna [name] WNUUV'O Date: 11/29/2016 Reason for consult: Initial assessment   Maternal Data Has patient been taught Hand Expression?: Yes Does the patient have breastfeeding experience prior to this delivery?: No  Feeding Feeding Type: Breast Fed Length of feed: 45 min  LATCH Score Latch: Grasps breast easily, tongue down, lips flanged, rhythmical sucking.  Audible Swallowing: A few with stimulation  Type of Nipple: Everted at rest and after stimulation  Comfort (Breast/Nipple): Soft / non-tender  Hold (Positioning): Assistance needed to correctly position infant at breast and maintain latch.  LATCH Score: 8  Interventions Interventions: Breast feeding basics reviewed;Assisted with latch;Breast compression;Skin to skin;Adjust position;Breast massage;Support  pillows;Hand pump;Hand express;Position options  Lactation Tools Discussed/Used Tools: Pump;Flanges Flange Size: 27;30 Breast pump type: Manual WIC Program: No Pump Review: Setup, frequency, and cleaning;Milk Storage Initiated by:: Allayne Stack RN IBCLC Date initiated:: 11/29/16   Consult Status Consult Status: Follow-up Date: 11/29/16 Follow-up type: In-patient    Theodoro Kalata 11/29/2016, 4:40 AM

## 2016-11-29 NOTE — Consult Note (Signed)
Neurology Consultation  Reason for Consult: Bilateral lower extremity weakness Referring Physician: Dr. Ola Spurr  CC: Bilateral lower extremity numbness and weakness  History is obtained from: Patient, chart  HPI: Brianna [name] is a 41 y.o. female with no significant past medical history who is postpartum day 1, postoperative day 1 status post D&C who started complaining of being unable to lift her weight on her legs and her legs buckling along with decreased to no sensation in the medial aspect of her thigh and leg, which has persisted nearly 24 hours after her complicated vacuum-assisted delivery and D&C. The patient was in her usual state of health prior to the delivery.  She had a complicated vacuum-assisted delivery and D&C.  Her legs were in the stirrup for an extended period of time as well as it being a difficult delivery needing vacuum-assisted methods followed by D&C, had her in lithotomy position for an extended period of time per the patient.  The patient also had an epidural catheter up until later tonight, pending coags, which came back normalizing after which the catheter was removed. Dr. Ola Spurr who is the anesthetist on service, noted that she is continues to have weakness of her knee flexors which was isolated with all other muscle groups being nearly full strength, called in for a neurological consultation. 1 of the primary concerns that Dr. Ola Spurr had was for a possible epidural hematoma versus this being a neuropathy. Patient has no history of neck injury or spinal cord injury.  She has no history of similar symptoms in the past.  There is no aggravating or relieving factors.  She says that her numbness on the medial aspect of her thigh and leg is improving but she still feels that it is different and more numb than tingly.  She is able to move her hips but is unable to extend her knee at all.  ROS: A 14 point ROS was performed and is negative except as noted in the HPI.    Past Medical History:  Diagnosis Date  . Headache   . Newborn product of in vitro fertilization (IVF) pregnancy     Family History  Problem Relation Age of Onset  . Hyperlipidemia Mother   . Hyperlipidemia Father     Social History:   reports that  has never smoked. she has never used smokeless tobacco. She reports that she drinks alcohol. She reports that she does not use drugs.  Medications  Current Facility-Administered Medications:  .  acetaminophen (TYLENOL) tablet 650 mg, 650 mg, Oral, Q4H PRN, Paula Compton, MD .  benzocaine-Menthol (DERMOPLAST) 20-0.5 % topical spray 1 application, 1 application, Topical, PRN, Paula Compton, MD .  coconut oil, 1 application, Topical, PRN, Paula Compton, MD, 1 application at 75/17/00 0236 .  witch hazel-glycerin (TUCKS) pad 1 application, 1 application, Topical, PRN **AND** dibucaine (NUPERCAINAL) 1 % rectal ointment 1 application, 1 application, Rectal, PRN, Paula Compton, MD .  diphenhydrAMINE (BENADRYL) capsule 25 mg, 25 mg, Oral, Q6H PRN, Paula Compton, MD .  ondansetron [facility]) tablet 4 mg, 4 mg, Oral, Q4H PRN **OR** ondansetron (ZOFRAN) injection 4 mg, 4 mg, Intravenous, Q4H PRN, Paula Compton, MD .  oxyCODONE (Oxy IR/ROXICODONE) immediate release tablet 10 mg, 10 mg, Oral, Q4H PRN, Paula Compton, MD .  oxyCODONE (Oxy IR/ROXICODONE) immediate release tablet 5 mg, 5 mg, Oral, Q4H PRN, Paula Compton, MD .  prenatal multivitamin tablet 1 tablet, 1 tablet, Oral, Q1200, Paula Compton, MD, 1 tablet at 11/29/16 1214 .  senna-docusate (Senokot-S)  tablet 2 tablet, 2 tablet, Oral, Q24H, Paula Compton, MD, 2 tablet at 11/28/16 2305 .  simethicone (MYLICON) chewable tablet 80 mg, 80 mg, Oral, PRN, Paula Compton, MD .  Tdap (BOOSTRIX) injection 0.5 mL, 0.5 mL, Intramuscular, Once, Paula Compton, MD .  zolpidem [facility] tablet 5 mg, 5 mg, Oral, QHS PRN, Paula Compton, MD  Exam: Current vital  signs: BP (!) 156/69 (BP Location: Left Arm) Comment: notified nurse  Pulse 91   Temp 98.6 F (37 C) (Oral)   Resp 17   Wt 81.6 kg (180 lb)   LMP 02/16/2016 (Exact Date)   SpO2 100%   Breastfeeding? Unknown   BMI 29.05 kg/m   Vital signs in last 24 hours: Temp:  [97.8 F (36.6 C)-98.6 F (37 C)] 98.6 F (37 C) (11/15 1946) Pulse Rate:  [73-96] 91 (11/15 1946) Resp:  [16-18] 17 (11/15 1946) BP: (124-158)/(64-83) 156/69 (11/15 1946) SpO2:  [99 %-100 %] 100 % (11/15 1946)  GENERAL: Very pleasant patient, awake, alert in NAD HEENT: - Normocephalic and atraumatic, dry mm, no LN++, no Thyromegally LUNGS - Clear to auscultation bilaterally with no wheezes CV - S1S2 RRR, no m/r/g, equal pulses bilaterally. ABDOMEN - Soft, nontender, nondistended with normoactive BS Ext: warm, well perfused, intact peripheral pulses, no edema NEURO:  Mental Status: AA&Ox3  Language: speech is clear.  Naming, repetition, fluency, and comprehension intact. Cranial Nerves: PERRL. EOMI, visual fields full, no facial asymmetry, facial sensation intact, hearing intact, tongue/uvula/soft palate midline, normal sternocleidomastoid and trapezius muscle strength. No evidence of tongue atrophy or fibrillations Motor: 5/5 bilateral upper extremities.  4+/5 bilateral hip flexors.  1/5 bilateral knee extensors.  5/5 knee flexors.  5/5 plantar and dorsiflexors bilaterally. Tone: is normal and bulk is normal Sensation-decreased sensation to temperature light touch and pinprick in the medial thigh and medial aspect of the leg. Coordination: FTN intact bilaterally Gait- deferred Deep tendon reflexes: 3+ all over with downgoing toes and no Hoffmann sign.  Labs I have reviewed labs in epic and the results pertinent to this consultation are:  CBC    Component Value Date/Time   WBC 13.2 (H) 11/29/2016 1641   RBC 2.73 (L) 11/29/2016 1641   HGB 8.9 (L) 11/29/2016 1641   HCT 25.3 (L) 11/29/2016 1641   PLT 116 (L)  11/29/2016 1925   MCV 92.7 11/29/2016 1641   MCH 32.6 11/29/2016 1641   MCHC 35.2 11/29/2016 1641   RDW 13.6 11/29/2016 1641   LYMPHSABS 1.9 11/29/2016 1641   MONOABS 0.5 11/29/2016 1641   EOSABS 0.1 11/29/2016 1641   BASOSABS 0.0 11/29/2016 1641    CMP     Component Value Date/Time   NA 131 (L) 11/29/2016 0720   K 4.5 11/29/2016 0720   CL 105 11/29/2016 0720   CO2 20 (L) 11/29/2016 0720   GLUCOSE 107 (H) 11/29/2016 0720   BUN 23 (H) 11/29/2016 0720   CREATININE 1.36 (H) 11/29/2016 0720   CALCIUM 6.9 (L) 11/29/2016 0720   PROT 4.6 (L) 11/29/2016 0720   ALBUMIN 2.1 (L) 11/29/2016 0720   AST 39 11/29/2016 0720   ALT 19 11/29/2016 0720   ALKPHOS 114 11/29/2016 0720   BILITOT 0.4 11/29/2016 0720   GFRNONAA 47 (L) 11/29/2016 0720   GFRAA 55 (L) 11/29/2016 0720   Imaging I have reviewed the images obtained:  CT-scan of the lumbar spine showed the epidural catheter in place with no evidence of an epidural hematoma or abscess.  Assessment:  41 year old woman with acute  onset of bilateral quadriceps weakness the following a complicated vaginal delivery which was vacuum-assisted followed by D&C, postoperative day 1. Her symptoms are most likely secondary to femoral neuropathy due to nerve compression during the delivery. I had a detailed discussion with her and her husband at the bedside.  Her sensation that is improving seems to be a good sign but her strength still is 1/5 at the knee extensors. I told him that the neuropathic process can take time to recover anywhere from 4-6 weeks.  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 If her symptoms do not start to improve in the next few days, consider doing an MRI of the lumbar spine and pelvis. She will need an outpatient neurology follow-up along with an EMG nerve conduction studies to confirm the femoral neuropathy  electrographically. [facility] or [facility] neurology. Primary team to provide referral on discharge please. The plan was relayed to the patient and her husband at bedside. I will attempt to reach the referring physician to personally relayed the plan. Neurology will be available as needed.  Please call with questions.  -- Amie Portland, MD [facility] [phone] If 7pm to 7am, please call on call as listed on AMION.

## 2016-11-29 NOTE — Progress Notes (Signed)
Follow up note: Called to see patient post CT scan due to infusion clamp on epidural catheter being ripped off at some point during transfer to and from radiology. Noted that catheter marking at 5cm total at skin. Reviewed CT scan results with no evidence of hematoma or space occupying lesion and DIC panel with no evidence of coagulopathy. Epidural pulled as likely no longer in epidural space. Tip was intact. Neurologist, Dr. Malen Gauze to see patient tonight. We will continue to follow and monitor her progress.  Deatra Canter, MD

## 2016-11-30 ENCOUNTER — Encounter (HOSPITAL_COMMUNITY): Payer: Self-pay | Admitting: Obstetrics and Gynecology

## 2016-11-30 ENCOUNTER — Other Ambulatory Visit: Payer: Self-pay

## 2016-11-30 DIAGNOSIS — G5720 Lesion of femoral nerve, unspecified lower limb: Secondary | ICD-10-CM

## 2016-11-30 LAB — COMPREHENSIVE METABOLIC PANEL
ALBUMIN: 2.3 g/dL — AB (ref 3.5–5.0)
ALK PHOS: 103 U/L (ref 38–126)
ALT: 20 U/L (ref 14–54)
AST: 36 U/L (ref 15–41)
Anion gap: 7 (ref 5–15)
BILIRUBIN TOTAL: 0.6 mg/dL (ref 0.3–1.2)
BUN: 19 mg/dL (ref 6–20)
CALCIUM: 7.2 mg/dL — AB (ref 8.9–10.3)
CO2: 22 mmol/L (ref 22–32)
Chloride: 107 mmol/L (ref 101–111)
Creatinine, Ser: 1.06 mg/dL — ABNORMAL HIGH (ref 0.44–1.00)
GFR calc Af Amer: >60 mL/min (ref >60)
GFR calc non Af Amer: >60 mL/min (ref >60)
GLUCOSE: 102 mg/dL — AB (ref 65–99)
Potassium: 4.3 mmol/L (ref 3.5–5.1)
SODIUM: 136 mmol/L (ref 135–145)
TOTAL PROTEIN: 4.7 g/dL — AB (ref 6.5–8.1)

## 2016-11-30 LAB — DIC (DISSEMINATED INTRAVASCULAR COAGULATION)PANEL
Fibrinogen: 411 mg/dL (ref 210–475)
INR: 0.95
Platelets: 116 10*3/uL — ABNORMAL LOW (ref 150–400)
aPTT: 27 seconds (ref 24–36)

## 2016-11-30 LAB — CBC
HCT: 24.6 % — ABNORMAL LOW (ref 36.0–46.0)
HEMOGLOBIN: 8.5 g/dL — AB (ref 12.0–15.0)
MCH: 32.6 pg (ref 26.0–34.0)
MCHC: 34.6 g/dL (ref 30.0–36.0)
MCV: 94.3 fL (ref 78.0–100.0)
Platelets: 123 10*3/uL — ABNORMAL LOW (ref 150–400)
RBC: 2.61 MIL/uL — AB (ref 3.87–5.11)
RDW: 13.9 % (ref 11.5–15.5)
WBC: 9.7 10*3/uL (ref 4.0–10.5)

## 2016-11-30 LAB — DIC (DISSEMINATED INTRAVASCULAR COAGULATION) PANEL
D DIMER QUANT: 3.82 ug{FEU}/mL — AB (ref 0.00–0.50)
PROTHROMBIN TIME: 12.6 s (ref 11.4–15.2)
SMEAR REVIEW: NONE SEEN

## 2016-11-30 MED ORDER — LABETALOL HCL 100 MG PO TABS: 200.0000 mg | ORAL_TABLET | Freq: Two times a day (BID) | ORAL | Status: DC

## 2016-11-30 MED ORDER — RHO D IMMUNE GLOBULIN 1500 UNIT/2ML IJ SOSY
300.0000 ug | PREFILLED_SYRINGE | Freq: Once | INTRAMUSCULAR | Status: AC
  Administered 2016-11-30: 300 ug via INTRAVENOUS
  Filled 2016-11-30: qty 2

## 2016-11-30 MED ORDER — LABETALOL HCL 200 MG PO TABS
200.0000 mg | ORAL_TABLET | Freq: Two times a day (BID) | ORAL | Status: DC
  Administered 2016-11-30 – 2016-12-04 (×8): 200 mg via ORAL
  Filled 2016-11-30: qty 1
  Filled 2016-11-30: qty 2
  Filled 2016-11-30: qty 1
  Filled 2016-11-30 (×2): qty 2
  Filled 2016-11-30: qty 1
  Filled 2016-11-30: qty 2
  Filled 2016-11-30: qty 1
  Filled 2016-11-30: qty 2

## 2016-11-30 MED ORDER — HYDRALAZINE HCL 20 MG/ML IJ SOLN: 10.0000 mg | Freq: Once | INTRAMUSCULAR | Status: DC | PRN

## 2016-11-30 MED ORDER — LABETALOL HCL 5 MG/ML IV SOLN
20.0000 mg | INTRAVENOUS | Status: DC | PRN
  Administered 2016-11-30: 40 mg via INTRAVENOUS
  Administered 2016-11-30: 20 mg via INTRAVENOUS
  Filled 2016-11-30: qty 8
  Filled 2016-11-30: qty 4

## 2016-11-30 NOTE — Progress Notes (Signed)
Dr. Melba Coon called given update of PT recommendations.  MD to place orders.   Message left with Dr. Marvel Plan.

## 2016-11-30 NOTE — Anesthesia Postprocedure Evaluation (Signed)
Anesthesia Post Note  Patient: [name]  Procedure(s) Performed: AN AD HOC LABOR EPIDURAL     Patient location during evaluation: Mother Baby Anesthesia Type: Epidural Level of consciousness: awake and alert Pain management: pain level controlled Vital Signs Assessment: post-procedure vital signs reviewed and stable Respiratory status: spontaneous breathing, nonlabored ventilation and respiratory function stable Cardiovascular status: stable Postop Assessment: no headache, no backache and epidural receding Anesthetic complications: no Comments: Persistent motor weakness bilat with leg extension. Also parathesias on inner thighs. 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 CT of lumbar spine on 11/15 was negative for hematoma.    Last Vitals:  Vitals:   11/30/16 0344 11/30/16 0745  BP: 134/66 131/63  Pulse: 78 73  Resp: 17 18  Temp:  37 C  SpO2: 96% 98%    Last Pain:  Vitals:   11/30/16 1142  TempSrc:   PainSc: 3    Pain Goal: Patients Stated Pain Goal: 4 (11/29/16 0855)               Montez Hageman

## 2016-11-30 NOTE — Progress Notes (Signed)
Pt was taken down to radiology for a ct scan, Radiology tech returned a piece of the infusion clamp on epidural cath that was ripped off at some point during transfer to and fro of the radiology bed. Dr Ola Spurr was notified and came to assess pt and removed epidural cath. Pt tolerated well and RN will continue to monitor site.

## 2016-11-30 NOTE — Lactation Note (Signed)
This note was copied from a baby's chart. Lactation Consultation Note  Patient Name: Brianna [name] Date: 11/30/2016 Reason for consult: Follow-up assessment;Infant weight loss   Follow up with mom of 74 hour old infant. Infant with 9 BF for 10-40 minutes, EBM x 4 of 3-7 cc, 6 voids and 1 stool in last 24 hours. Infant weight 6 lb 2.6 oz with 8% weight loss since birth.   Mom reports infant has been feeding a lot today. Discussed supply and demand and cluster feeding. Infant was latched and intermittently feeding, she was not noted to have many swallows at this time. Worked with mom to get deeper latch and infant then fell asleep. Mom with very large nipples and right nipple per mom looked normal when infant came off, it appeared a little compressed. Mom with milk tenderness with latch, she is to use coconut oil when pumping and after BF. Mom has a bottle of 30 cc EBM at the bedside. GM to supplement infant with pumped EBM via bottle.   Enc mom to continue pumping post BF and supplementing infant with EBM due to weight loss and decreased stooling, mom voiced understanding.   Enc mom to call out for feeding assistance as needed. Report to Colin Mulders, RN.    Maternal Data Formula Feeding for Exclusion: No Has patient been taught Hand Expression?: Yes Does the patient have breastfeeding experience prior to this delivery?: No  Feeding Feeding Type: Breast Fed Length of feed: 20 min  LATCH Score Latch: Repeated attempts needed to sustain latch, nipple held in mouth throughout feeding, stimulation needed to elicit sucking reflex.  Audible Swallowing: A few with stimulation  Type of Nipple: Everted at rest and after stimulation  Comfort (Breast/Nipple): Filling, red/small blisters or bruises, mild/mod discomfort  Hold (Positioning): Assistance needed to correctly position infant at breast and maintain latch.  LATCH Score: 6  Interventions Interventions: Breast feeding  basics reviewed;Adjust position;Assisted with latch;Support pillows;Skin to skin;Breast massage  Lactation Tools Discussed/Used Flange Size: 27 Breast pump type: Double-Electric Breast Pump Pump Review: Setup, frequency, and cleaning;Milk Storage Initiated by:: Bedside RN   Consult Status Consult Status: Follow-up Date: 12/01/16 Follow-up type: In-patient    Debby Freiberg Allard Lightsey 11/30/2016, 3:31 PM

## 2016-11-30 NOTE — Progress Notes (Signed)
Dr. Melba Coon called and given update:  Pt received 52m labetalol- BP reassessed: 160/70 41mlabetalol given- BP reassessed: 134/56  Pt states clear vision field, her right eye "just feels tired".  Pt educated of signs of PIH. Will continue to monitor.

## 2016-11-30 NOTE — Progress Notes (Signed)
Pt assisted to unit handicap shower via stedy with assist from RN, NT and husband.  Patient showered with husband assist.  Pt stood on stedy weight bearing for approx 3 min prior to shower.  Pt states improvement from yesterday with use of stedy.    Physical Therapy called this am, will come to unit this afternoon.

## 2016-11-30 NOTE — Progress Notes (Signed)
Post Partum Day 2 Came to see patient after notified of issues of probable femoral nerve injury  Subjective: Pt reports feeling much better this AM as far as preeclampsia symptoms and fatigue.  Bleeding normal. She has not tried to weight bear this AM but feels sensation to medial  thighs slowly improving.  Able to flex thighs all the way up this AM, but still cannot extend knees at all really  Objective: Blood pressure 131/63, pulse 73, temperature 98.6 F (37 C), temperature source Oral, resp. rate 18, weight 81.6 kg (180 lb), last menstrual period 02/16/2016, SpO2 98 %, unknown if currently breastfeeding.  Physical Exam:  General: alert and cooperative Lochia: appropriate Uterine Fundus: firm DVT Evaluation: No evidence of DVT seen on physical exam, SCD's on  Recent Labs    11/29/16 0720 11/29/16 1641  HGB 9.5* 8.9*  HCT 26.9* 25.3*    Assessment/Plan: D/w pt injury in detail.  I am honestly unsure of etiology as patient was in stirrups for one hour of pushing only prio to delivery and additional 30-40 mins for repair, which is actually on the short side for a first time delivery.  She then had several hours with legs down and was in stirrups for one hour for D&C.  Cumulative time in stirrups was not excessive.  I d/w her that I did feel her anatomy after delivery was a bit unusual as we had difficulty reaching her fundus and visualizing cervix.  Irregardless, she needs PT to help with mobility while the nerves recover and will get them here today.   We may also get an MRI of pelvis if does not make good progress today, just to ensure no other pathology--will require transport off campus to achieve.  Foley in for now, until assess mobility.  BP stable.  Will recheck labs one more time to ensure creatinine normalizing as well as platelets   LOS: 3 days   Logan Bores 11/30/2016, 8:27 AM

## 2016-11-30 NOTE — Evaluation (Signed)
Physical Therapy Evaluation Patient Details Name: Brianna [name] MRN: [id number] DOB: 1975-08-19 Today's Date: 11/30/2016   History of Present Illness  Pt s/p vaginal delivery and post-op with bil quad weakness.  Pt with bil femoral nerve injury.   Clinical Impression  Pt presents to PT with significant impaired mobility due to bilateral quad weakness. Pt is unable to stand or perform scoot transfer without significant assist. Poor core strength due to pregnancy and childbirth make compensatory strategies difficult. At this point feel pt needs Inpatient Rehab (CIR) to maximize potential and allow her to go home at a functional level. Pt and husband agreeable. Pt very motivated to work toward independence and be able to care for newborn.    Follow Up Recommendations CIR    Equipment Recommendations  Wheelchair (measurements PT);Wheelchair cushion (measurements PT);Rolling walker with 5" wheels;Other (comment)(drop arm commode)    Recommendations for Other Services Rehab consult;OT consult     Precautions / Restrictions Precautions Precautions: Fall Restrictions Weight Bearing Restrictions: No      Mobility  Bed Mobility Overal bed mobility: Needs Assistance Bed Mobility: Supine to Sit;Sit to Supine     Supine to sit: Min assist Sit to supine: Supervision   General bed mobility comments: Assist to elevate trunk into sitting  Transfers Overall transfer level: Needs assistance Equipment used: Rolling walker (2 wheeled) Transfers: Sit to/from Stand;Lateral/Scoot Transfers Sit to Stand: +2 physical assistance;Mod assist        Lateral/Scoot Transfers: +2 physical assistance;Min assist General transfer comment: For sit to stand used knee immobilizer on RLE and assist to bring hips up and block lt knee. For bed to w/c using scoot transfer assist to raise hips. Verbal cues for technique. Armrest of w/c removed.  Ambulation/Gait             General Gait Details: With knee  immobilizer on rt and rt knee in hyperextension pt able to perform forward/backward step in place with LLE +2 min assist   Stairs            Wheelchair Mobility    Modified Rankin (Stroke Patients Only)       Balance Overall balance assessment: Needs assistance Sitting-balance support: No upper extremity supported;Feet supported Sitting balance-Leahy Scale: Good     Standing balance support: Bilateral upper extremity supported Standing balance-Leahy Scale: Poor Standing balance comment: UE support due to quad weakness                             Pertinent Vitals/Pain Pain Assessment: Faces Faces Pain Scale: Hurts a little bit Pain Location: perineal soreness Pain Descriptors / Indicators: Grimacing;Guarding Pain Intervention(s): Limited activity within patient's tolerance    Home Living Family/patient expects to be discharged to:: Private residence Living Arrangements: Spouse/significant other Available Help at Discharge: Family;Available 24 hours/day Type of Home: House Home Access: Stairs to enter   [employer] of Steps: 1+1 Home Layout: Two level;Bed/bath upstairs;1/2 bath on main level Home Equipment: None      Prior Function Level of Independence: Independent         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[employer]        Extremity/Trunk Assessment   Upper Extremity Assessment Upper Extremity Assessment: Overall WFL for tasks assessed    Lower Extremity Assessment Lower Extremity Assessment: RLE deficits/detail;LLE deficits/detail RLE Deficits / Details: knee ext 0/5, knee flex WFL; hip flex, ext, abd, add WFL, ankle 4+/5  RLE Sensation: decreased light touch(medial thigh) LLE Deficits / Details: knee ext 0/5, knee flex WFL; hip flex, ext, abd, add WFL, ankle 4+/5 LLE Sensation: decreased light touch(decr light touch medial thigh)       Communication   Communication: No difficulties  Cognition  Arousal/Alertness: Awake/alert Behavior During Therapy: WFL for tasks assessed/performed Overall Cognitive Status: Within Functional Limits for tasks assessed                                        General Comments      Exercises General Exercises - Upper Extremity Chair Push Up: Other (comment)(Instructed in ) Other Exercises Other Exercises: Instructed to perform bridging and attempt quad sets   Assessment/Plan    PT Assessment Patient needs continued PT services  PT Problem List Decreased strength;Decreased balance;Decreased mobility;Decreased knowledge of use of DME;Impaired sensation       PT Treatment Interventions DME instruction;Gait training;Stair training;Functional mobility training;Therapeutic activities;Therapeutic exercise;Balance training;Neuromuscular re-education;Patient/family education    PT Goals (Current goals can be found in the Care Plan section)  Acute Rehab PT Goals Patient Stated Goal: get better and return to prior level PT Goal Formulation: With patient/family Time For Goal Achievement: 12/14/16 Potential to Achieve Goals: Good    Frequency Min 4X/week   Barriers to discharge Inaccessible home environment;Other (comment) Stairs to enter home. Newborn baby to care for    Co-evaluation               AM-PAC PT "6 Clicks" Daily Activity  Outcome Measure Difficulty turning over in bed (including adjusting bedclothes, sheets and blankets)?: A Little Difficulty moving from lying on back to sitting on the side of the bed? : Unable Difficulty sitting down on and standing up from a chair with arms (e.g., wheelchair, bedside commode, etc,.)?: Unable Help needed moving to and from a bed to chair (including a wheelchair)?: A Lot Help needed walking in hospital room?: Total Help needed climbing 3-5 steps with a railing? : Total 6 Click Score: 9    End of Session Equipment Utilized During Treatment: Gait belt Activity Tolerance:  Patient tolerated treatment well Patient left: in bed;with call bell/phone within reach;with family/visitor present Nurse Communication: Mobility status;Need for lift equipment PT Visit Diagnosis: Other abnormalities of gait and mobility (R26.89);Other symptoms and signs involving the nervous system (R29.898)    Time: 1820-1900 PT Time Calculation (min) (ACUTE ONLY): 40 min   Charges:   PT Evaluation $PT Eval Moderate Complexity: 1 Mod PT Treatments $Therapeutic Activity: 8-22 mins   PT G CodesMarland Kitchen        [facility] [place] 11/30/2016, 7:45 PM

## 2016-11-30 NOTE — Progress Notes (Signed)
Subjective: Feels slightly better, but still with   Exam: Vitals:   11/30/16 1347 11/30/16 1409  BP: (!) 160/70 (!) 134/56  Pulse:    Resp:    Temp:    SpO2:     Gen: In bed, NAD Resp: non-labored breathing, no acute distress Abd: soft, nt  Neuro: MS: awake, alert, oriented IR:JJOAC, EOMI Motor: She has bilateral quadriceps weakness, preserved adduction, hip flexion.  Sensory:decreased to medial thighs, in the distribution of the anterior femoral cutaneous nerve. She has preserved sensation in the lateral fem cutaneous nerve distribution.   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 There is no sign of lat fem cutaneous nerve or obturator involvement which would support injury in the inguinal canal(  and not support plexopathy or radiculopathy. At this point, I think that she will have gradual improvement over time, but the recovery could be quite long(on the order of months). If it is just neurapraxia, recovery could be faster.  With the bilateral nature, I think that bilateral mass lesions or other pathology would be very unlikely.   Recommendations: 1) PT, OT 2) No further recommendations at this time. Please call with further questions or concerns.   Roland Rack, MD [facility] [phone]  If 7pm- 7am, please page neurology on call as listed in [place].

## 2016-11-30 NOTE — Progress Notes (Signed)
RN called to room.  Pt states "static vision with colors".  States this change just occurred (around 1300).  Decreased vision field upon assessment on Left mid to lower fields.   BP 172/75 MD notified.   Will recheck BP and treat with Labetalol per orders.

## 2016-11-30 NOTE — Progress Notes (Signed)
No change in motor weakness from yesterday. Still unable to bear weight. Inner thighs still numb. Slight soreness of back at epidural insertion site, but otherwise clean dry and intact.  Presumed diagnosis of bilateral femoral nerve injury, ? From stretch during delivery.  Appreciate input and management from Neurology.  [employer]

## 2016-12-01 LAB — RH IG WORKUP (INCLUDES ABO/RH)
ABO/RH(D): A NEG
FETAL SCREEN: NEGATIVE
Gestational Age(Wks): 40
Unit division: 0

## 2016-12-01 NOTE — Progress Notes (Signed)
Physical Therapy Treatment Patient Details Name: Brianna [name] MRN: [id number] DOB: [birth year] Today's Date: 12/01/2016    History of Present Illness Pt s/p vaginal delivery and post-op with bil quad weakness.  Pt with bil femoral nerve injury. No other significant PMH.    PT Comments    Pt is progressing well with her mobility.  We were able to walk a short distance with RW and two person assist in room with bil KIs donned.  I think she will be ready tomorrow to progress to hallway gait with chair to follow.  HEP given for her to do in the room with instructions to sit EOB for meals and reposition herself frequently.    Follow Up Recommendations  CIR     Equipment Recommendations  Wheelchair (measurements PT);Wheelchair cushion (measurements PT);Rolling walker with 5" wheels;Other (comment)(drop arm BSC)    Recommendations for Other Services Rehab consult;OT consult     Precautions / Restrictions Precautions Precautions: Fall Required Braces or Orthoses: Knee Immobilizer - Right;Knee Immobilizer - Left Other Brace/Splint: bil KI for gait.     Mobility  Bed Mobility Overal bed mobility: Needs Assistance Bed Mobility: Supine to Sit;Sit to Supine     Supine to sit: Supervision Sit to supine: Supervision   General bed mobility comments: Supervision for safety, cues for sequencing.   Transfers Overall transfer level: Needs assistance Equipment used: Rolling walker (2 wheeled) Transfers: Sit to/from Stand Sit to Stand: +2 physical assistance;Mod assist         General transfer comment: Two person mod assist to stand to RW.  Attempted x 1 with only one knee brace, and second time with both knee braces to see if she could get up to the walker with both donned and she could with assist to balance her trunk and to stabilize the RW.    Ambulation/Gait Ambulation/Gait assistance: +2 safety/equipment;Min assist Ambulation Distance (Feet): 5 Feet(x2) Assistive device: Rolling  walker (2 wheeled) Gait Pattern/deviations: Step-to pattern;Shuffle Gait velocity: decreased   General Gait Details: Pt able to walk 5' forward and backward x 2 with both KIs donned and RW.  Min assist at trunk for balance and safety and second person for safety.            Balance Overall balance assessment: Needs assistance Sitting-balance support: Feet supported;No upper extremity supported Sitting balance-Leahy Scale: Good     Standing balance support: Bilateral upper extremity supported Standing balance-Leahy Scale: Poor Standing balance comment: needs arms supported on RW to stand and outside support from therapist.                             Cognition Arousal/Alertness: Awake/alert Behavior During Therapy: WFL for tasks assessed/performed Overall Cognitive Status: Within Functional Limits for tasks assessed                                        Exercises General Exercises - Lower Extremity Ankle Circles/Pumps: AROM;Both;20 reps;Supine Quad Sets: AROM;Both;10 reps;Other (comment)(at least attempt, even if nothing really happens) Long Arc Quad: AAROM;Both;10 reps;Seated;Other (comment)(with leg loop to assist) Heel Slides: AROM;Both;10 reps;Supine Hip ABduction/ADduction: AROM;Both;10 reps;Supine Toe Raises: AROM;Both;10 reps;Seated Heel Raises: AROM;Both;10 reps;Seated Other Exercises Other Exercises: Pt given copy of HEP exercises and encouraged to do them 5 times per day. Copy in shadow chart.  Pt also encouraged to at least sit up  on the side of the bed for meals and for frequent position changes as she can get herself there with increased time and supervision.  Her mother was present the entire session.     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  I encouraged her to have her boot as she is more in the bed  than normal.       Pertinent Vitals/Pain Pain Assessment: Faces Faces Pain Scale: Hurts a little bit Pain Location: perineal soreness Pain Descriptors / Indicators: Grimacing;Guarding Pain Intervention(s): Limited activity within patient's tolerance;Monitored during session;Repositioned           PT Goals (current goals can now be found in the care plan section) Acute Rehab PT Goals Patient Stated Goal: get better and return to prior level Progress towards PT goals: Progressing toward goals    Frequency    Min 4X/week      PT Plan Current plan remains appropriate       AM-PAC PT "6 Clicks" Daily Activity  Outcome Measure  Difficulty turning over in bed (including adjusting bedclothes, sheets and blankets)?: A Little Difficulty moving from lying on back to sitting on the side of the bed? : A Little Difficulty sitting down on and standing up from a chair with arms (e.g., wheelchair, bedside commode, etc,.)?: Unable Help needed moving to and from a bed to chair (including a wheelchair)?: A Lot Help needed walking in hospital room?: A Little Help needed climbing 3-5 steps with a railing? : Total 6 Click Score: 13    End of Session   Activity Tolerance: Patient tolerated treatment well Patient left: in bed;with call bell/phone within reach;with family/visitor present Nurse Communication: Mobility status PT Visit Diagnosis: Other abnormalities of gait and mobility (R26.89);Other symptoms and signs involving the nervous system (X21.194)     Time: 1740-8144 PT Time Calculation (min) (ACUTE ONLY): 64 min  Charges:  $Gait Training: 8-22 mins $Therapeutic Exercise: 8-22 mins $Therapeutic Activity: 8-22 mins $Self Care/Home Management: 8-22          Alisa B. Alvar Malinoski, [place], DPT [phone]            12/01/2016, 11:11 AM

## 2016-12-01 NOTE — Progress Notes (Addendum)
Rehab Admissions Coordinator Note:  Patient was screened by Cleatrice Burke for appropriateness for an Inpatient Acute Rehab Admission  at [facility] per PT recommendation.. Inpt rehab is not a transfer, it is a discharge and admit, therefore insurance has to approve as well as accepting Rehab MD.   OT eval is needed to begin insurance authorization.I will contact pt Monday morning to arrange visit with pt at bedside for assessment and begin insurance authorization with [employer]. Possible admission pending insurance approval and my discussion with Rehab MD after my assessment for appropriateness of admission.  Cleatrice Burke 12/01/2016, 3:09 PM  I can be reached at [phone].

## 2016-12-01 NOTE — Lactation Note (Signed)
This note was copied from a baby's chart. Lactation Consultation Note  Patient Name: Brianna [name] [id number] Date: 12/01/2016   Baby 55 hours old.  Visted w/ mother per [place] MD request. Per MD mother had femoral nerve injury and will be transferred to [facility] on Monday for rehab. Mother is breastfeeding and pumping afterwards.  Last volume pumped was 40 ml. Reviewed hand expression with drops expressed.  Encouraged mother to hand express before feedings and before and after pumping. Also suggest mother view Hands on pumping video. Unsure if baby will be able to stay with mother while in rehab but if not, family members will bring baby to hospital for some feedings. Suggest mother request DEBP Symphony pump from Peds to be brought to her once she is at [facility].  Mother also will bring her personal DEBP to hospital for backup. Plan is for mother to breastfeed on demand while here in the hospital and post pump 4-6 times per day for up to 20 min. If mother and baby are separated, mother will pump q2.5-3h at [facility] with the exception of once during the night. FOB will start storing a few bottles today and tomorrow at home for when baby arrives back home on Monday if that happens. Mother would like LC to view latch tomorow before discharge. Discussed milk storage, transportation and cleaning.   Mother has [place] phone numbers to call tomorrow.        Maternal Data    Feeding Feeding Type: Breast Fed Length of feed: 15 min  LATCH Score                   Interventions    Lactation Tools Discussed/Used     Consult Status      [name] 12/01/2016, 12:55 PM

## 2016-12-01 NOTE — Progress Notes (Addendum)
Post Partum Day 3 (POD#3) Subjective: tolerating PO and has been evaluated by PT, working toward inpatient rehab.    Objective: Blood pressure (!) 141/67, pulse 68, temperature 98.7 F (37.1 C), temperature source Oral, resp. rate 18, weight 81.6 kg (180 lb), last menstrual period 02/16/2016, SpO2 98 %, unknown if currently breastfeeding.  Physical Exam:  General: alert and no distress Lochia: appropriate Uterine Fundus: firm   Recent Labs    11/29/16 1641 11/30/16 0940  HGB 8.9* 8.5*  HCT 25.3* 24.6*    Assessment/Plan: Breastfeeding and Lactation consult.  Working with PT - will attempt for transfer to inpt rehab Monday.  BP controlled with Labetalol 200 mg bid po.  Working on breastfeeding.  D/W pt inpt transfer.    D/W pt lactation to work with her to plan if has to go to inpt rehab D/W RROB for consult given ? Retained placenta after D&C - and help with post delivery questions.     LOS: 4 days   Brianna [name] 12/01/2016, 9:26 AM

## 2016-12-02 NOTE — Progress Notes (Signed)
Post Partum Day 4 Subjective: no complaints, tolerating PO and nl lochia, pain controlled.  Pt working with PT.  can flex quads today!    Objective: Blood pressure (!) 159/66, pulse 61, temperature 98.4 F (36.9 C), temperature source Oral, resp. rate 18, weight 81.6 kg (180 lb), last menstrual period 02/16/2016, SpO2 97 %, unknown if currently breastfeeding.  Physical Exam:  General: alert and no distress Lochia: appropriate Uterine Fundus: firm   Recent Labs    11/29/16 1641 11/30/16 0940  HGB 8.9* 8.5*  HCT 25.3* 24.6*    Assessment/Plan: Plan for transfer to inpt rehab tomorrow after rehab evaluation, Breastfeeding and Lactation consult. Will ask re catheter removal.  Pt decided to remove catheter, will do trial of out - will work with RN for transfers will try to adjust bed if needs bedpan.  If need to replace will give daily macrodantin.     LOS: 5 days   Gustavo Dispenza B[facility]ard[place]Stuckert 12/02/2016, 8:49 AM

## 2016-12-02 NOTE — Lactation Note (Signed)
This note was copied from a baby's chart. Lactation Consultation Note: Mother pumping when I arrived in the room. Mother is using #27 flanges and nipple is filling the tunnel . Mother denies discomfort but request #30 flanges. Mother pumping after each feeding. Mother post pumped approx. 80 ml.  Mother reports that infant is feeding well. She describes that infant is getting the entire nipple in her mouth but not much of the areola. Mother reports that infant is suckling and she thinks she observes swallows.   Mother unsure if she will be discharged tomorrow. Reviewed hand expression and advised to continue to pump after each feeding.  Mother advised to have husband use a wide base bottle nipple and explained paced bottle feeding.   Mother receptive to all teaching. Mother to page for [facility] if discharge tomorrow. Mother has a medela PIS at home. She was reminded to take all parts of pump when she leaves and to ask for a Symphony from Peds. Mother is aware of available [place] services.,  Patient Name: [name] CVELF'Y Date: 12/02/2016 Reason for consult: Follow-up assessment   Maternal Data    Feeding    LATCH Score                   Interventions    Lactation Tools Discussed/Used Flange Size: 30 Breast pump type: Double-Electric Breast Pump   Consult Status Consult Status: Follow-up Date: 12/02/16 Follow-up type: In-patient    [name] [facility] 12/02/2016, 1:35 PM

## 2016-12-02 NOTE — Progress Notes (Signed)
Physical Therapy Treatment Patient Details Name: Brianna [name] MRN: [id number] DOB: 06-Oct-1975 Today's Date: 12/02/2016    History of Present Illness Pt s/p vaginal delivery and post-op with bil quad weakness.  Pt with bil femoral nerve injury. No other significant PMH.    PT Comments    Patient making slow improvements with mobility and gait.  Trace quadricep contractions noted.  Patient highly motivated.  Worked with patient and staff on [facility] transfers today. May also use Stedy to move to [facility].   Agree with need for CIR stay prior to returning home.   Follow Up Recommendations  CIR     Equipment Recommendations  Wheelchair (measurements PT);Wheelchair cushion (measurements PT);Rolling walker with 5" wheels;Other (comment)(Drop-arm BSC)    Recommendations for Other Services Rehab consult;OT consult     Precautions / Restrictions Precautions Precautions: Fall Precaution Comments: Trace quadricep strength bilaterally Required Braces or Orthoses: Knee Immobilizer - Right;Knee Immobilizer - Left Other Brace/Splint: bil KI for gait.  Restrictions Weight Bearing Restrictions: No    Mobility  Bed Mobility Overal bed mobility: Modified Independent Bed Mobility: Supine to Sit;Sit to Supine     Supine to sit: Modified independent (Device/Increase time) Sit to supine: Modified independent (Device/Increase time)   General bed mobility comments: No assist needed. Good balance sitting EOB.  Transfers Overall transfer level: Needs assistance Equipment used: None;Rolling walker (2 wheeled) Transfers: Sit to/from [employer] Sit to Stand: Min assist;+2 safety/equipment   Squat pivot transfers: Min assist;Mod assist;+2 safety/equipment     General transfer comment: Patient instructed on squat-pivot transfer bed <> BSC (foley now removed). Patient able to use UE's to raise hips and pivot to [facility] with min assist.  To return to bed, required mod assist due to UE fatigue  (patient had been ambulating prior to transfers).  At end of session, nursing staff instructed in and practiced bed <> [facility] transfers with patient.  Transfers will be easier with drop-arm BSC.  Also instructed nursing to use Stedy if patient fatigued, or not comfortable with transfers. In supine, donned Bilateral knee immobilizers.  Min assist to steady as patient lifted body and scooted feet under her.  Ambulation/Gait Ambulation/Gait assistance: Min assist;+2 safety/equipment Ambulation Distance (Feet): 28 Feet Assistive device: Rolling walker (2 wheeled) Gait Pattern/deviations: Step-to pattern;Shuffle Gait velocity: decreased Gait velocity interpretation: Below normal speed for age/gender General Gait Details: Min assist for safety.  Patient taking slow, short, shuffling steps with BLE KI's.  Heavy reliance on UE's on RW, so UE's fatigue quickly.   Stairs            Wheelchair Mobility    Modified Rankin (Stroke Patients Only)       Balance   Sitting-balance support: Feet supported;No upper extremity supported Sitting balance-Leahy Scale: Good     Standing balance support: Bilateral upper extremity supported Standing balance-Leahy Scale: Poor Standing balance comment: needs arms supported on RW to stand and outside support from therapist.                             Cognition Arousal/Alertness: Awake/alert Behavior During Therapy: WFL for tasks assessed/performed Overall Cognitive Status: Within Functional Limits for tasks assessed                                        Exercises General Exercises - Lower Extremity Ankle Circles/Pumps: AROM;Both;20  reps;Supine Quad Sets: AROM;Both;10 reps;Supine;Limitations Quad Sets Limitations: Did note trace contractions bil quads Gluteal Sets: AROM;Both;10 reps;Supine Heel Slides: AROM;Both;10 reps;Supine Hip ABduction/ADduction: AROM;Both;10 reps;Supine Other Exercises Other Exercises: Bridging,  AROM, Both 10 reps; Supine    General Comments        Pertinent Vitals/Pain Pain Assessment: Faces Faces Pain Scale: Hurts a little bit Pain Location: perineal soreness Pain Descriptors / Indicators: Grimacing Pain Intervention(s): Monitored during session    Home Living                      Prior Function            PT Goals (current goals can now be found in the care plan section) Acute Rehab PT Goals Patient Stated Goal: get better and return to prior level Progress towards PT goals: Progressing toward goals    Frequency    Min 4X/week      PT Plan Current plan remains appropriate    Co-evaluation              AM-PAC PT "6 Clicks" Daily Activity  Outcome Measure  Difficulty turning over in bed (including adjusting bedclothes, sheets and blankets)?: None Difficulty moving from lying on back to sitting on the side of the bed? : A Little Difficulty sitting down on and standing up from a chair with arms (e.g., wheelchair, bedside commode, etc,.)?: Unable Help needed moving to and from a bed to chair (including a wheelchair)?: A Little Help needed walking in hospital room?: A Little Help needed climbing 3-5 steps with a railing? : A Lot 6 Click Score: 16    End of Session Equipment Utilized During Treatment: Gait belt;Right knee immobilizer;Left knee immobilizer Activity Tolerance: Patient tolerated treatment well;Patient limited by fatigue Patient left: in bed;with call bell/phone within reach;with family/visitor present(sitting EOB for lunch) Nurse Communication: Mobility status;Need for lift equipment(BSC transfers squat-pivot or with Stedy) PT Visit Diagnosis: Other abnormalities of gait and mobility (R26.89);Other symptoms and signs involving the nervous system (T55.732)     Time: 2025-4270 PT Time Calculation (min) (ACUTE ONLY): 43 min  Charges:  $Gait Training: 8-22 mins $Therapeutic Activity: 23-37 mins                    G Codes:        Carita Pian. Sanjuana Kava, [facility] Acute Rehab Services Pager [facility] 12/02/2016, 2:12 PM

## 2016-12-03 ENCOUNTER — Other Ambulatory Visit: Payer: Self-pay

## 2016-12-03 NOTE — H&P (Deleted)
  The note originally documented on this encounter has been moved the the encounter in which it belongs.  

## 2016-12-03 NOTE — Lactation Note (Signed)
This note was copied from a baby's chart. Lactation Consultation Note  Patient Name: Brianna [name] Date: 12/03/2016 Reason for consult: Follow-up assessment;1st time breastfeeding;Engorgement   Follow up with mom of 43 day old infant. Infant with 6 BF for 15-30 minutes, EBM x 2 30-50 cc, 3 voids and 2 stools in last 24 hours.   Infant awakened to feed, mom latched her to the right breast in the football hold. Infant needed a lot of stimulation to maintain suckling. Infant with a few swallows. Enc mom to massage/compress breast with feeding and to stimulate infant to maintain feeding. Enc mom to offer pumped EBM post BF when sleepy at the breast.   Mom reports sometimes infant is frantic at the breast, enc mom to offer 15-30 cc via bottle if needed and then latch to breast.   Mom is to possible be transferred to rehab today. Infant may go home with FOB and GM. They will bring infant to the hospital if allowed to continue to BF and bottle feed at home.   Mom is very full this morning, discussed engorgement prevention/treatment and if breasts not softening with feeding/ pumping to apply ice to breasts for 15-20 minutes before pumping/feeding to decrease swelling. Mom is to pump since infant just fed.   Mom reports no further questions/concerns at this time.    Maternal Data Formula Feeding for Exclusion: No Has patient been taught Hand Expression?: Yes Does the patient have breastfeeding experience prior to this delivery?: No  Feeding Feeding Type: Breast Fed Length of feed: 15 min  LATCH Score Latch: Repeated attempts needed to sustain latch, nipple held in mouth throughout feeding, stimulation needed to elicit sucking reflex.  Audible Swallowing: A few with stimulation  Type of Nipple: Everted at rest and after stimulation  Comfort (Breast/Nipple): Soft / non-tender  Hold (Positioning): Assistance needed to correctly position infant at breast and maintain  latch.  LATCH Score: 7  Interventions Interventions: Breast feeding basics reviewed;Adjust position;DEBP;Assisted with latch;Support pillows;Skin to skin;Breast massage;Breast compression  Lactation Tools Discussed/Used Initiated by:: Reviewed and encouraged after every feeding   Consult Status Consult Status: PRN Follow-up type: Call as needed    Donn Pierini 12/03/2016, 9:59 AM

## 2016-12-03 NOTE — Progress Notes (Signed)
OT Evaluation  PTA, pt worked full time as an Forensic psychologist and was independent with ADL and mobility. Pt presents with significant functional decline, requiring mod A with mobility and ADL and would benefit from intensive rehab at CIR to maximize functional level of independence with occupational performance. Feel pt can achieve modified independence level by participating in CIR. Very supportive family. Will follow acutely to address established goals.    12/03/16 0935  OT Visit Information  Last OT Received On 12/03/16  Assistance Needed +2 (Safety with gait)  PT/OT/SLP Co-Evaluation/Treatment Yes (partial session for gait)  OT goals addressed during session ADL's and self-care  History of Present Illness Pt s/p vaginal delivery and post-op with bil quad weakness.  Pt with bil femoral nerve injury. Pt with pre-eclampsia. PMH - stress fx's in both feet in later stages of pregnancy. Per pt - hx of B plantar fascittis  Precautions  Precautions Fall  Precaution Comments Trace quadricep strength bilaterally  Required Braces or Orthoses Knee Immobilizer - Right;Knee Immobilizer - Left (for gait )  Other Brace/Splint bil KI for gait.   Restrictions  Weight Bearing Restrictions No  Home Living  Family/patient expects to be discharged to: Private residence  Living Arrangements Spouse/significant other  Available Help at Discharge Family;Available 24 hours/day  Type of Home House  Home Access Stairs to enter  Entrance Stairs-Number of Steps 1+1  Home Layout Two level;Bed/bath upstairs;1/2 bath on main level  Alternate Level Stairs-Number of Steps flight  Engineer, manufacturing systems Yes  How Accessible Accessible via walker (will ck on wc availability)  Home Equipment None  Prior Function  Level of Independence Independent  Comments works as Chief Executive Officer for [employer] No difficulties  Pain Assessment  Pain  Assessment No/denies pain  Cognition  Arousal/Alertness Awake/alert  Behavior During Therapy WFL for tasks assessed/performed  Overall Cognitive Status Within Functional Limits for tasks assessed  Upper Extremity Assessment  Upper Extremity Assessment Overall WFL for tasks assessed  Lower Extremity Assessment  Lower Extremity Assessment Defer to PT evaluation  RLE Deficits / Details BLE movement has improved since PT evaluation  RLE Sensation (medial thigh)  LLE Sensation (decr light touch medial thigh)  Cervical / Trunk Assessment  Cervical / Trunk Assessment Other exceptions (s/p vaginal delivery)  ADL  Overall ADL's  Needs assistance/impaired  Grooming Set up;Sitting  Upper Body Bathing Set up;Sitting  Lower Body Bathing Minimal assistance;Bed level;Sitting/lateral leans  Upper Body Dressing  Set up;Sitting  Lower Body Dressing Moderate assistance;Sitting/lateral leans;Bed level  Toilet Transfer Moderate assistance;BSC;RW  Toileting- Clothing Manipulation and Hygiene Moderate assistance;Sit to/from stand;Sitting/lateral lean  Functional mobility during ADLs Moderate assistance;Rolling walker  General ADL Comments Pt able to cross legs over knees in sitting to address LB ADL. Expressing concners over her role of caregiver.Pt unableto safely take steps without use of B KI at this time.   Vision- History  Baseline Vision/History Wears glasses  Bed Mobility  Overal bed mobility Modified Independent  Bed Mobility Supine to Sit  Transfers  Overall transfer level Needs assistance  Equipment used Rolling walker (2 wheeled)  Transfers Sit to/from Stand  Sit to Stand Mod assist  General transfer comment mod A to power up and control descent to bed  Balance  Overall balance assessment No apparent balance deficits (not formally assessed);Needs assistance  Sitting-balance support Feet supported;No upper extremity supported  Sitting balance-Leahy Scale Good  Standing balance support  Bilateral upper extremity  supported  Standing balance-Leahy Scale Poor  Standing balance comment unable to release RW with hands when standing without KI  General Exercises - Upper Extremity  Chair Push Up (Instructed in )  Other Exercises  Other Exercises scooting along bedside to foot of bed, then returning to midline  OT - End of Session  Equipment Utilized During Treatment Gait belt;Rolling walker;Right knee immobilizer;Left knee immobilizer  Activity Tolerance Patient tolerated treatment well  Patient left in bed;with family/visitor present;Other (comment) (working with PT)  Nurse Communication Mobility status  OT Assessment  OT Recommendation/Assessment Patient needs continued OT Services  OT Visit Diagnosis Other abnormalities of gait and mobility (R26.89);Muscle weakness (generalized) (M62.81)  OT Problem List Decreased strength;Impaired balance (sitting and/or standing);Decreased knowledge of use of DME or AE;Impaired sensation  OT Plan  OT Frequency (ACUTE ONLY) Min 3X/week  OT Treatment/Interventions (ACUTE ONLY) Self-care/ADL training;Therapeutic exercise;DME and/or AE instruction;Therapeutic activities;Patient/family education;Balance training  AM-PAC OT "6 Clicks" Daily Activity Outcome Measure  Help from another person eating meals? 4  Help from another person taking care of personal grooming? 4  Help from another person toileting, which includes using toliet, bedpan, or urinal? 2  Help from another person bathing (including washing, rinsing, drying)? 3  Help from another person to put on and taking off regular upper body clothing? 3  Help from another person to put on and taking off regular lower body clothing? 3  6 Click Score 19  ADL G Code Conversion CK  OT Recommendation  Recommendations for Other Services Rehab consult  Follow Up Recommendations CIR  OT Equipment 3 in 1 bedside commode;Tub/shower bench;Wheelchair (measurements OT);Wheelchair cushion (measurements  OT)  Individuals Consulted  Consulted and Agree with Results and Recommendations Patient  Acute Rehab OT Goals  Patient Stated Goal get better and return to prior level  OT Goal Formulation With patient  Time For Goal Achievement 12/17/16  Potential to Achieve Goals Good  OT Time Calculation  OT Start Time (ACUTE ONLY) 0830  OT Stop Time (ACUTE ONLY) 0915  OT Time Calculation (min) 45 min  OT General Charges  $OT Visit 1 Visit  OT Evaluation  $OT Eval Moderate Complexity 1 Mod  OT Treatments  $Self Care/Home Management  8-22 mins  Written Expression  Dominant Hand Right  [facility], OT/L  [phone] 12/03/2016

## 2016-12-03 NOTE — Progress Notes (Signed)
I spoke with pt by phone and have arranged to do onsite assessment today between 2 and 3 pm. I have discussed case with Rehab MD Dr. Delice Lesch and have begun authorization with [facility] for a possible admit Tuesday pending insurance approval and my discussions with pt today. [phone]

## 2016-12-03 NOTE — H&P (Signed)
Physical Medicine and Rehabilitation Admission H&P    CC: Functional deficits due to femoral nerve injury.  HPI: Brianna [name] is a 41 y.o. female admitted  on 11/28/16 at 75 5/7 weeks for IOL with elevated BP and 2+ proteinuria,  s/p delivery and s/p D&C who on postpartum day 1 started complaining of being unable to lift her legs and had difficulty weight bearing on BLE with sensory loss.  Patient with complicated delivery with legs in stirrups for extended period of time and epidural catheter. Neurology consulted for work up due to concerns of epidural hematoma v/s neuropathy. CT lumbar spine negative with negative coag studies. Dr. 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  Therapy ongoing and patient showing improvement in quadricepts movements but continues to have functional deficits. CIR recommended for follow up therapy.    Review of Systems  Constitutional: Negative for chills and fever.  HENT: Negative for hearing loss and tinnitus.   Eyes: Negative for blurred vision and double vision.  Respiratory: Negative for cough and shortness of breath.   Cardiovascular: Negative for chest pain and palpitations.  Gastrointestinal: Negative for constipation, heartburn and nausea.  Genitourinary: Negative for dysuria and urgency.  Musculoskeletal: Negative for back pain, joint pain and myalgias.  Skin: Negative for rash.  Neurological: Positive for dizziness (at end of activity), sensory change (Carpel tunnel due to pregnancy. Numbness bilateral feed and medial aspect BLE) and focal weakness. Negative for headaches.  Psychiatric/Behavioral: The patient is not nervous/anxious and does not have insomnia.      Past Medical History:  Diagnosis Date  . Headache   . Newborn product of in vitro fertilization (IVF) pregnancy     Past Surgical History:  Procedure Laterality Date  . DILATION AND CURETTAGE OF UTERUS N/A  11/28/2016   Procedure: DILATATION AND CURETTAGE;  Surgeon: Paula Compton, MD;  Location: [place];  Service: Gynecology;  Laterality: N/A;  . WISDOM TOOTH EXTRACTION    . WRIST FRACTURE SURGERY     r/wrist    Family History  Problem Relation Age of Onset  . Hyperlipidemia Mother   . Hyperlipidemia Father     Social History:  Married. Working as Haematologist. She was active PTA--was running till a couple of months PTA then had transitioned to elliptical and yoga. She reports that  has never smoked. she has never used smokeless tobacco. She reports that she was drinking a glass of wine daily prior to pregnancy. She reports that she does not use drugs.    Allergies  Allergen Reactions  . Sulfa Antibiotics Nausea And Vomiting   Medications Prior to Admission  Medication Sig Dispense Refill  . Calcium-Magnesium-Vitamin D 185-50-100 MG-MG-UNIT CAPS Take 1 tablet daily by mouth.    . Prenatal Vit-Fe Fumarate-FA (PRENATAL MULTIVITAMIN) TABS tablet Take 1 tablet daily at 12 noon by mouth.      Drug Regimen Review  Drug regimen was reviewed and remains appropriate with no significant issues identified  Home: Home Living Family/patient expects to be discharged to:: Private residence Living Arrangements: Spouse/significant other Available Help at Discharge: (spouse FMLA and pt's local Mother to assist) Type of Home: House Home Access: Stairs to enter Technical brewer of Steps: 1+1 Home Layout: Two level, Bed/bath upstairs, 1/2 bath on main level Alternate Level Stairs-Number of Steps: flight Bathroom Shower/Tub: Multimedia programmer: Standard Bathroom Accessibility: Yes Home Equipment: None  Lives With: Spouse   Functional  History: Prior Function Level of Independence: Independent 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[employer]:  Mobility: Bed Mobility Overal bed mobility: Modified Independent Bed Mobility: Supine to Sit Supine to  sit: Modified independent (Device/Increase time) Sit to supine: Modified independent (Device/Increase time) General bed mobility comments: Pt sitting EOB Transfers Overall transfer level: Needs assistance Equipment used: Rolling walker (2 wheeled) Transfers: Sit to/from Stand Sit to Stand: Mod assist Squat pivot transfers: Min assist, Mod assist, +2 safety/equipment  Lateral/Scoot Transfers: +2 physical assistance, Min assist General transfer comment: mod A to power up and control descent to bed Ambulation/Gait Ambulation/Gait assistance: Min assist, +2 safety/equipment Ambulation Distance (Feet): 70 Feet Assistive device: Rolling walker (2 wheeled) Gait Pattern/deviations: Step-to pattern, Decreased step length - right, Decreased step length - left General Gait Details: Verbal cues to push walker a little further ahead. Pt with bil knee immobilizers on to keep knees from buckling. Heavy reliance on UE's. Assist for support. Gait velocity: decreased Gait velocity interpretation: Below normal speed for age/gender    ADL: ADL Overall ADL's : Needs assistance/impaired Grooming: Set up, Sitting Upper Body Bathing: Set up, Sitting Lower Body Bathing: Minimal assistance, Bed level, Sitting/lateral leans Upper Body Dressing : Set up, Sitting Lower Body Dressing: Moderate assistance, Sitting/lateral leans, Bed level Toilet Transfer: Moderate assistance, BSC, RW Toileting- Clothing Manipulation and Hygiene: Moderate assistance, Sit to/from stand, Sitting/lateral lean Functional mobility during ADLs: Moderate assistance, Rolling walker General ADL Comments: Pt able to cross legs over knees in sitting to address LB ADL. Expressing concners over her role of caregiver  Cognition: Cognition Overall Cognitive Status: Within Functional Limits for tasks assessed Orientation Level: Oriented X4 Cognition Arousal/Alertness: Awake/alert Behavior During Therapy: WFL for tasks  assessed/performed Overall Cognitive Status: Within Functional Limits for tasks assessed  Physical Exam: Blood pressure 136/82, pulse 69, temperature 98.6 F (37 C), temperature source Oral, resp. rate 18, height 5' 6"  (1.676 m), weight 81.6 kg (180 lb), last menstrual period 02/16/2016, SpO2 100 %, unknown if currently breastfeeding. Physical Exam  Nursing note and vitals reviewed. Constitutional: She is oriented to person, place, and time. She appears well-developed and well-nourished.  HENT:  Head: Normocephalic and atraumatic.  Mouth/Throat: Oropharynx is clear and moist.  Eyes: Conjunctivae are normal. Pupils are equal, round, and reactive to light.  Neck: Normal range of motion. Neck supple.  Cardiovascular: Normal rate and regular rhythm. Exam reveals no gallop.  No murmur heard. Respiratory: Effort normal and breath sounds normal. No stridor. No respiratory distress. She has no wheezes. She has no rales.  GI: Soft. Bowel sounds are normal. She exhibits no distension. There is no tenderness.  Minimal tenderness.  Musculoskeletal: She exhibits no edema or tenderness.  Neurological: She is alert and oriented to person, place, and time.  Patient cognitively appropriate with normal insight and awareness as well as memory.  Upper extremity strength is 5 out of 5.  In lower extremities hip flexion grossly 4 out of 5.  Abduction of the hips is 4-5 out of 5.  Adduction is 3+ to 4 out of 5.  Knee extension 2+ to 3- out of 5 bilaterally.  Hamstrings are 4+ out of 5.  Ankle dorsiflexion and plantar flexion are 5 out of 5.  She has decreased sensation to light touch over the medial thigh knee and leg bilaterally.  Reflexes are absent in both knees  Skin: Skin is warm and dry.  Psychiatric: She has a normal mood and affect. Her behavior is normal. Judgment  and thought content normal.    No results found for this or any previous visit (from the past 48 hour(s)). No results found.     Medical  Problem List and Plan: 1.  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  DVT Prophylaxis/Anticoagulation: Mechanical: Sequential compression devices, below knee Bilateral lower extremities 3. Pain Management: oxycodone prn 4. Mood: LCSW to follow for evaluation and support.  5. Neuropsych: This patient is capable of making decisions on her own behalf. 6. Skin/Wound Care: routine pressure relief measures 7. Fluids/Electrolytes/Nutrition: Monitor I/O. Check lytes in am.  8.  ABLA: Will continue to monitor. Add iron supplement.  9. AKI: Improving--encourage fluid intake.  10. Thrombocytopenia: Resolving. Monitor for recovery post delivery/.     Post Admission Physician Evaluation: 1. Functional deficits secondary  to bilateral femoral nerve versus posterior division lumbar plexus injury. 2. Patient is admitted to receive collaborative, interdisciplinary care between the physiatrist, rehab nursing staff, and therapy team. 3. Patient's level of medical complexity and substantial therapy needs in context of that medical necessity cannot be provided at a lesser intensity of care such as a SNF. 4. Patient has experienced substantial functional loss from his/her baseline which was documented above under the "Functional History" and "Functional Status" headings.  Judging by the patient's diagnosis, physical exam, and functional history, the patient has potential for functional progress which will result in measurable gains while on inpatient rehab.  These gains will be of substantial and practical use upon discharge  in facilitating mobility and self-care at the household level. 5. Physiatrist will provide 24 hour management of medical needs as well as oversight of the therapy plan/treatment and provide guidance as appropriate regarding the interaction of the two. 6. The Preadmission Screening has  been reviewed and patient status is unchanged unless otherwise stated above. 7. 24 hour rehab nursing will assist with bladder management, bowel management, safety, skin/wound care, disease management, medication administration, pain management and patient education  and help integrate therapy concepts, techniques,education, etc. 8. PT will assess and treat for/with: Lower extremity strength, range of motion, stamina, balance, functional mobility, safety, adaptive techniques and equipment, neuromuscular reeducation, pain control, orthotics.   Goals are: mod I. 9. OT will assess and treat for/with: ADL's, functional mobility, safety, upper extremity strength, adaptive techniques and equipment, community reentry, ego support, orthotic use.   Goals are: mod I. Therapy may proceed with showering this patient. 10. SLP will assess and treat for/with: n/a.  Goals are: n/a. 11. Case Management and Social Worker will assess and treat for psychological issues and discharge planning. 12. Team conference will be held weekly to assess progress toward goals and to determine barriers to discharge. 13. Patient will receive at least 3 hours of therapy per day at least 5 days per week. 14. ELOS: 7-10 days       15. Prognosis:  excellent     [name], MD, [facility] 12/04/2016  Bary Leriche, Hershal Coria 12/04/2016

## 2016-12-03 NOTE — Care Management (Signed)
Received call from Danne Baxter (Rehab Admission Coordinator)# [phone]. Pamala Hurry will call CM at [facility]  in the am on Tuesday 12/04/16 b/c may be a possible admission to [facility], awaiting insurance approval (per Marland Mcalpine). CM will continue to follow and assist as needed.

## 2016-12-03 NOTE — Progress Notes (Addendum)
Patient ID: Brianna [name], female   DOB: Jun 06, 1975, 41 y.o.   MRN: [id number] Pt doing well. No complaints. Bonding with baby. Had good session with PT earlier today per notes. No HAs   VSS 119-133/71-72  A/P: PPD#5 s/p svd with retained placental fragment and b/l femoral nerve fragment - recovering well - BP stable - plan to transfer to rehab tomorrow

## 2016-12-03 NOTE — Progress Notes (Signed)
I met with pt and her spouse at bedside to discuss goals and expectations of an inpt rehab admission. They are in agreement to admit pending insurance authorization to [facility] which is pending. I will follow up with pt and MD once insurance has confirmed authorization. I discussed with RN CM, Lynnette. [phone]

## 2016-12-03 NOTE — Discharge Summary (Signed)
OB Discharge Summary     Patient Name: Brianna [name] DOB: August 14, 1975 MRN: [id number]  Date of admission: 11/27/2016 Delivering MD: Paula Compton   Date of discharge: 12/04/2016  Admitting diagnosis: 40.5wks High BP                                       Severe preeclampsia  Intrauterine pregnancy: [redacted]w[redacted]d    Secondary diagnosis:  Active Problems:   Pregnancy   Status post vacuum-assisted vaginal delivery Retained placenta requiring postpartum D&C  Additional problems: Bilateral femoral nerve injury     Discharge diagnosis: Term Pregnancy Delivered and Preeclampsia (severe)                                                                                                Post partum procedures: Physical therapy                                              CT Scan                                              Occupational therapy  Augmentation: AROM and Foley Balloon  Complications:  Retained placenta  Hospital course:  Induction of Labor With Vaginal Delivery   41y.o. yo G1P1001 at 426w6das admitted to the hospital 11/27/2016 for induction of labor.  Indication for induction: Preeclampsia.  Patient had an uncomplicated labor course as follows: Membrane Rupture Time/Date: 11:25 PM ,11/27/2016   Intrapartum Procedures: Episiotomy: None [1]                                         Lacerations:  2nd degree [3]  Patient had delivery of a Viable infant.  Information for the patient's newborn:  WiWeyman Rodney0[[id number]]Delivery Method: Vag-Vacuum  There was a retained placenta that was partially manually extracted and then a postpartum D&C performed with all but about 3cm of placenta able to be removed. (see operative note)  11/28/2016. Details of delivery can be found in separate delivery note, but she had an uncomplicated vacuum assisted delivery after pushing about 1 hour for fetal heart rate indications.   Patient had a postpartum course complicated by an unexpected  bilateral femoral nerve injury.  She was evaluated for a spinal hematoma and cleared and evaluated by neurology who felt it was just an isolated femoral nerve injury. Patient has been working with PT and OT but [facility] continue therapy and get her prepared for home therapy.  Her severe preeclampsia improved with good diuresis but she did require labetalol to control her blood pressure  and will remain on that for now, weaning in next few weeks.  Physical exam  Vitals:   12/03/16 1230 12/03/16 1620 12/03/16 1930 12/04/16 0011  BP: 119/71 133/72 (!) 149/62 (!) 123/47  Pulse: 64 70 62 69  Resp: 17 18 18 18   Temp: 98.2 F (36.8 C) 98.6 F (37 C) 97.9 F (36.6 C) 98.2 F (36.8 C)  TempSrc: Oral Oral Oral Oral  SpO2: 98% 98%  99%  Weight:      Height:       General: alert and cooperative Lochia: appropriate Uterine Fundus: firm one below umbilicus Able to flex feet bilaterally, flex hip bilaterally, move legs side to side.  Quad muscles will flex and has slight extension    Labs: Lab Results  Component Value Date   WBC 9.7 11/30/2016   HGB 8.5 (L) 11/30/2016   HCT 24.6 (L) 11/30/2016   MCV 94.3 11/30/2016   PLT 123 (L) 11/30/2016   CMP Latest Ref Rng & Units 11/30/2016  Glucose 65 - 99 mg/dL 102(H)  BUN 6 - 20 mg/dL 19  Creatinine 0.44 - 1.00 mg/dL 1.06(H)  Sodium 135 - 145 mmol/L 136  Potassium 3.5 - 5.1 mmol/L 4.3  Chloride 101 - 111 mmol/L 107  CO2 22 - 32 mmol/L 22  Calcium 8.9 - 10.3 mg/dL 7.2(L)  Total Protein 6.5 - 8.1 g/dL 4.7(L)  Total Bilirubin 0.3 - 1.2 mg/dL 0.6  Alkaline Phos 38 - 126 U/L 103  AST 15 - 41 U/L 36  ALT 14 - 54 U/L 20    Discharge instruction: per After Visit Summary and "Baby and Me Booklet".  After visit meds:  Allergies as of 12/04/2016      Reactions   Sulfa Antibiotics Nausea And Vomiting      Medication List    TAKE these medications   Calcium-Magnesium-Vitamin D 185-50-100  MG-MG-UNIT Caps Take 1 tablet daily by mouth.   ibuprofen 200 MG tablet Commonly known as:  ADVIL Take 3 tablets (600 mg total) every 6 (six) hours as needed by mouth.   labetalol 200 MG tablet Commonly known as:  NORMODYNE Take 1 tablet (200 mg total) 2 (two) times daily by mouth.   prenatal multivitamin Tabs tablet Take 1 tablet daily at 12 noon by mouth.       Diet: routine diet  Activity: Advance as tolerated. Pelvic rest for 6 weeks.   Outpatient follow up:2 weeks Follow up Appt:No future appointments. Follow up Visit:No Follow-up on file.  Postpartum contraception: Undecided  Newborn Data: Live born female  Birth Weight: 6 lb 11.1 oz (3035 g) APGAR: 71, 9  Newborn Delivery   Birth date/time:  11/28/2016 11:52:00 Delivery type:  Vaginal, Vacuum (Extractor)     Baby Feeding: Breast Disposition:  Baby d/c home with family 12/03/16   12/04/2016 Logan Bores, MD

## 2016-12-03 NOTE — Progress Notes (Addendum)
2000: Received patient awake, alert and siting up in bed. Patient reported having full sensation to her legs but continues to have 'tingling"  In the inner aspect of bi-lateral thighs. She still cannot stand independently, but is able to pivot from bed to chair. We continue to assist her with toileting on the bedside commode.   2100: Patient was informed that we would be doing hourly rounding on her and her baby throughout the night. Patient informed us that there was no need to because her "baby is fine" and "she is here because of me" and would prefer not to be disturbed during sleep. We will respect patient's sleep.  0000: Pt refused HS senokot tabs stating " I don't need it".

## 2016-12-03 NOTE — Progress Notes (Signed)
Physical Therapy Treatment Patient Details Name: Brianna [name] MRN: [id number] DOB: 29-Jul-1975 Today's Date: 12/03/2016    History of Present Illness Pt s/p vaginal delivery and post-op with bil quad weakness.  Pt with bil femoral nerve injury. Pt with pre-eclampsia. PMH - stress fx's in both feet in later stages of pregnancy    PT Comments    Pt making steady progress with mobility but continues to just have trace quadriceps. Requiring min assist for sit to stand with bed elevated 6-8 inches. Will require much more assistance to perform from normal surface height. Continues to be motivated. Continue to recommend rehab for pt to reach higher functional status prior to return home with newborn to care for.   Follow Up Recommendations  CIR     Equipment Recommendations  Wheelchair (measurements PT);Wheelchair cushion (measurements PT);Rolling walker with 5" wheels;Other (comment)(Drop-arm BSC)    Recommendations for Other Services Rehab consult;OT consult     Precautions / Restrictions Precautions Precautions: Fall Precaution Comments: Trace quadricep strength bilaterally Required Braces or Orthoses: Knee Immobilizer - Right;Knee Immobilizer - Left Other Brace/Splint: bil KI for gait.  Restrictions Weight Bearing Restrictions: No    Mobility  Bed Mobility               General bed mobility comments: Pt sitting EOB  Transfers Overall transfer level: Needs assistance Equipment used: Rolling walker (2 wheeled) Transfers: Sit to/from Stand Sit to Stand: From elevated surface;Min assist         General transfer comment: Bed elevated 6-8 inches to allow pt to position feet with bil knee immobilizers on underneath her. Assist to bring hips up and verbal cues for hand placement.  Ambulation/Gait Ambulation/Gait assistance: Min assist;+2 safety/equipment Ambulation Distance (Feet): 70 Feet Assistive device: Rolling walker (2 wheeled) Gait Pattern/deviations: Step-to  pattern;Decreased step length - right;Decreased step length - left Gait velocity: decreased Gait velocity interpretation: Below normal speed for age/gender General Gait Details: Verbal cues to push walker a little further ahead. Pt with bil knee immobilizers on to keep knees from buckling. Heavy reliance on UE's. Assist for support.   Stairs            Wheelchair Mobility    Modified Rankin (Stroke Patients Only)       Balance Overall balance assessment: No apparent balance deficits (not formally assessed) Sitting-balance support: Feet supported;No upper extremity supported Sitting balance-Leahy Scale: Good     Standing balance support: Bilateral upper extremity supported Standing balance-Leahy Scale: Poor Standing balance comment: walker, knee immobilizers and min guard for static standing.                            Cognition Arousal/Alertness: Awake/alert Behavior During Therapy: WFL for tasks assessed/performed Overall Cognitive Status: Within Functional Limits for tasks assessed                                        Exercises General Exercises - Lower Extremity Long Arc Quad: AAROM;Both;10 reps;Seated    General Comments        Pertinent Vitals/Pain Pain Assessment: No/denies pain    Home Living                      Prior Function            PT Goals (current goals can now be found  in the care plan section) Progress towards PT goals: Progressing toward goals    Frequency    Min 4X/week      PT Plan Current plan remains appropriate    Co-evaluation              AM-PAC PT "6 Clicks" Daily Activity  Outcome Measure  Difficulty turning over in bed (including adjusting bedclothes, sheets and blankets)?: None Difficulty moving from lying on back to sitting on the side of the bed? : A Little Difficulty sitting down on and standing up from a chair with arms (e.g., wheelchair, bedside commode, etc,.)?:  Unable Help needed moving to and from a bed to chair (including a wheelchair)?: A Little Help needed walking in hospital room?: A Little Help needed climbing 3-5 steps with a railing? : A Lot 6 Click Score: 16    End of Session Equipment Utilized During Treatment: Gait belt;Right knee immobilizer;Left knee immobilizer Activity Tolerance: Patient tolerated treatment well;Patient limited by fatigue Patient left: in bed;with call bell/phone within reach;with family/visitor present(sitting EOB) Nurse Communication: Mobility status;Need for lift equipment(Stedy if needed) PT Visit Diagnosis: Other abnormalities of gait and mobility (R26.89);Other symptoms and signs involving the nervous system (R29.898)     Time: 3810-1751 PT Time Calculation (min) (ACUTE ONLY): 17 min  Charges:  $Gait Training: 8-22 mins                    G Codes:       [facility] PT [place] 12/03/2016, 9:31 AM

## 2016-12-03 NOTE — Progress Notes (Signed)
Post Partum Day 5 Subjective: Pt in good spirits.  Continuing to work with PT and OT here now to assess.  Has been held here awaiting transfer to inpatient rehab for hopefully short-term admission to increase mobility and get home ready for her while recovering from her femoral nerve injury.  Objective: Blood pressure 130/62, pulse 61, temperature 97.9 F (36.6 C), temperature source Oral, resp. rate 18, height 5' 6"  (1.676 m), weight 81.6 kg (180 lb), last menstrual period 02/16/2016, SpO2 99 %, unknown if currently breastfeeding.  Physical Exam:  General: alert and cooperative Lochia: appropriate Uterine Fundus: firm--involuting nicely--down to one below umbilicus DVT Evaluation: no swelling  Able to flex feet bilaterally, flex hip bilaterally, move legs side to side.  Quad muscles will flex but not strong enough to kick leg   Recent Labs    11/30/16 0940  HGB 8.5*  HCT 24.6*    Assessment/Plan: OT here to assess and hopefully can get transfer to inpatient rehab to work more intensively on regaining mobility and get home safe for her to continue recovery Lochia normal, she knows to let me know of any excessive bleeding given still likely has placental fragment, if no bleeding, goal is to let her recover mobility and f/u with outpatient [place] in 2-3 weeks to see if tissue remains. BP stable on labetalol. Inpatient rehab to let me know if cleared for transfer later today   LOS: 6 days   Logan Bores 12/03/2016, 8:47 AM

## 2016-12-04 ENCOUNTER — Encounter (HOSPITAL_COMMUNITY): Payer: Self-pay | Admitting: Physical Medicine and Rehabilitation

## 2016-12-04 ENCOUNTER — Inpatient Hospital Stay (HOSPITAL_COMMUNITY)
Admission: RE | Admit: 2016-12-04 | Discharge: 2016-12-08 | DRG: 091 | Disposition: A | Discharge status: Home Health | Payer: BLUE CROSS/BLUE SHIELD | Source: Other Acute Inpatient Hospital | Attending: Physical Medicine & Rehabilitation | Admitting: Physical Medicine & Rehabilitation

## 2016-12-04 ENCOUNTER — Encounter (HOSPITAL_COMMUNITY): Payer: Self-pay | Admitting: *Deleted

## 2016-12-04 DIAGNOSIS — X58XXXD Exposure to other specified factors, subsequent encounter: Secondary | ICD-10-CM | POA: Diagnosis present

## 2016-12-04 DIAGNOSIS — O9081 Anemia of the puerperium: Secondary | ICD-10-CM | POA: Diagnosis present

## 2016-12-04 DIAGNOSIS — I10 Essential (primary) hypertension: Secondary | ICD-10-CM

## 2016-12-04 DIAGNOSIS — S7410XA Injury of femoral nerve at hip and thigh level, unspecified leg, initial encounter: Secondary | ICD-10-CM | POA: Diagnosis present

## 2016-12-04 DIAGNOSIS — N179 Acute kidney failure, unspecified: Secondary | ICD-10-CM | POA: Diagnosis not present

## 2016-12-04 DIAGNOSIS — O904 Postpartum acute kidney failure: Secondary | ICD-10-CM | POA: Diagnosis not present

## 2016-12-04 DIAGNOSIS — R2689 Other abnormalities of gait and mobility: Secondary | ICD-10-CM | POA: Diagnosis not present

## 2016-12-04 DIAGNOSIS — O253 Malnutrition in the puerperium: Secondary | ICD-10-CM | POA: Diagnosis present

## 2016-12-04 DIAGNOSIS — O9A23 Injury, poisoning and certain other consequences of external causes complicating the puerperium: Secondary | ICD-10-CM | POA: Diagnosis not present

## 2016-12-04 DIAGNOSIS — M84375A Stress fracture, left foot, initial encounter for fracture: Secondary | ICD-10-CM | POA: Diagnosis not present

## 2016-12-04 DIAGNOSIS — E46 Unspecified protein-calorie malnutrition: Secondary | ICD-10-CM | POA: Diagnosis not present

## 2016-12-04 DIAGNOSIS — S344XXD Injury of lumbosacral plexus, subsequent encounter: Secondary | ICD-10-CM | POA: Diagnosis not present

## 2016-12-04 DIAGNOSIS — D62 Acute posthemorrhagic anemia: Secondary | ICD-10-CM | POA: Diagnosis present

## 2016-12-04 DIAGNOSIS — S7410XS Injury of femoral nerve at hip and thigh level, unspecified leg, sequela: Secondary | ICD-10-CM

## 2016-12-04 DIAGNOSIS — D696 Thrombocytopenia, unspecified: Secondary | ICD-10-CM | POA: Diagnosis present

## 2016-12-04 DIAGNOSIS — R609 Edema, unspecified: Secondary | ICD-10-CM | POA: Diagnosis not present

## 2016-12-04 DIAGNOSIS — O1003 Pre-existing essential hypertension complicating the puerperium: Secondary | ICD-10-CM | POA: Diagnosis present

## 2016-12-04 DIAGNOSIS — R269 Unspecified abnormalities of gait and mobility: Secondary | ICD-10-CM

## 2016-12-04 MED ORDER — ONDANSETRON HCL 4 MG/2ML IJ SOLN: 4.0000 mg | INTRAMUSCULAR | Status: DC | PRN

## 2016-12-04 MED ORDER — IBUPROFEN 200 MG PO TABS: 600.0000 mg | ORAL_TABLET | Freq: Four times a day (QID) | ORAL | 0 refills | Status: DC | PRN

## 2016-12-04 MED ORDER — ACETAMINOPHEN 325 MG PO TABS: 650.0000 mg | ORAL_TABLET | ORAL | Status: DC | PRN

## 2016-12-04 MED ORDER — ALUM & MAG HYDROXIDE-SIMETH 200-200-20 MG/5ML PO SUSP: 30.0000 mL | ORAL | Status: DC | PRN

## 2016-12-04 MED ORDER — FLEET ENEMA 7-19 GM/118ML RE ENEM: 1.0000 | ENEMA | Freq: Once | RECTAL | Status: DC | PRN

## 2016-12-04 MED ORDER — BISACODYL 10 MG RE SUPP
10.0000 mg | Freq: Every day | RECTAL | Status: DC | PRN
Start: 2016-12-04 — End: 2016-12-08

## 2016-12-04 MED ORDER — POLYETHYLENE GLYCOL 3350 17 G PO PACK
17.0000 g | PACK | Freq: Every day | ORAL | Status: DC | PRN
  Administered 2016-12-06: 17 g via ORAL
  Filled 2016-12-04: qty 1

## 2016-12-04 MED ORDER — WITCH HAZEL-GLYCERIN EX PADS
1.0000 "application " | MEDICATED_PAD | CUTANEOUS | Status: DC | PRN
  Filled 2016-12-04: qty 100

## 2016-12-04 MED ORDER — ONDANSETRON HCL 4 MG PO TABS: 4.0000 mg | ORAL_TABLET | ORAL | Status: DC | PRN

## 2016-12-04 MED ORDER — BENZOCAINE-MENTHOL 20-0.5 % EX AERO
1.0000 "application " | INHALATION_SPRAY | CUTANEOUS | Status: DC | PRN
  Filled 2016-12-04: qty 56

## 2016-12-04 MED ORDER — DIPHENHYDRAMINE HCL 25 MG PO CAPS: 25.0000 mg | ORAL_CAPSULE | Freq: Four times a day (QID) | ORAL | Status: DC | PRN

## 2016-12-04 MED ORDER — LABETALOL HCL 200 MG PO TABS: 200.0000 mg | ORAL_TABLET | Freq: Two times a day (BID) | ORAL | 1 refills | Status: DC

## 2016-12-04 MED ORDER — PRENATAL MULTIVITAMIN CH
1.0000 | ORAL_TABLET | Freq: Every day | ORAL | Status: DC
  Filled 2016-12-04 (×4): qty 1

## 2016-12-04 MED ORDER — DIBUCAINE 1 % RE OINT: 1.0000 "application " | TOPICAL_OINTMENT | RECTAL | Status: DC | PRN

## 2016-12-04 MED ORDER — POLYSACCHARIDE IRON COMPLEX 150 MG PO CAPS
150.0000 mg | ORAL_CAPSULE | Freq: Every day | ORAL | Status: DC
  Administered 2016-12-04 – 2016-12-07 (×4): 150 mg via ORAL
  Filled 2016-12-04 (×4): qty 1

## 2016-12-04 MED ORDER — SIMETHICONE 80 MG PO CHEW: 80.0000 mg | CHEWABLE_TABLET | ORAL | Status: DC | PRN

## 2016-12-04 MED ORDER — OXYCODONE HCL 5 MG PO TABS: 5.0000 mg | ORAL_TABLET | ORAL | Status: DC | PRN

## 2016-12-04 MED ORDER — GUAIFENESIN-DM 100-10 MG/5ML PO SYRP: 5.0000 mL | ORAL_SOLUTION | Freq: Four times a day (QID) | ORAL | Status: DC | PRN

## 2016-12-04 MED ORDER — COCONUT OIL OIL: 1.0000 "application " | TOPICAL_OIL | Status: DC | PRN

## 2016-12-04 MED ORDER — ACETAMINOPHEN 325 MG PO TABS: 325.0000 mg | ORAL_TABLET | ORAL | Status: DC | PRN

## 2016-12-04 MED ORDER — TRAZODONE HCL 50 MG PO TABS: 25.0000 mg | ORAL_TABLET | Freq: Every evening | ORAL | Status: DC | PRN

## 2016-12-04 MED ORDER — OXYCODONE HCL 5 MG PO TABS: 10.0000 mg | ORAL_TABLET | ORAL | Status: DC | PRN

## 2016-12-04 MED ORDER — DIPHENHYDRAMINE HCL 12.5 MG/5ML PO ELIX: 12.5000 mg | ORAL_SOLUTION | Freq: Four times a day (QID) | ORAL | Status: DC | PRN

## 2016-12-04 NOTE — Care Management (Addendum)
Received call from Danne Baxter at  Around 1300-Rehab Admission Coordinator that patient has been approved from her insurance to be admitted to acute rehab today.  Informed CM that she will arrange transport via [facility] and call RN on uinit.  No needs for CM at this time.

## 2016-12-04 NOTE — Progress Notes (Signed)
Admit to unit, reviewed medications, schedule and plan of care. States an understanding of information and plan of care. Brianna [name]

## 2016-12-04 NOTE — Plan of Care (Signed)
Patient will be transitioning to a rehab unit to help her build strength and stamina in her thighs. At this time, she has weakness and is unable to stand without knee immobilizers and a walker, relying on her arm strength. She is able to transfer in a seated position onto a bedside commode. She seems anxious to get into rehab to regain her normal way of life. She has a supportive family. She is aware that she still needs to call for assistance to prevent injury in her transfers.

## 2016-12-04 NOTE — Progress Notes (Signed)
I have insurance approval to admit pt to inpatient acute rehab today, Room [id number] with Dr. Delice Lesch. I have notified patient, RN CM, bedside RN, as well as Dr. Marvel Plan. I have arranged transport with [facility] to pick up at 230 today. [phone]

## 2016-12-04 NOTE — PMR Pre-admission (Signed)
[facility] PMR Admission Coordinator Pre-Admission Assessment  Patient: Brianna [name] is an 41 y.o., female MRN: [id number] DOB: 29-Oct-1975 Height: 5' 6"  (167.6 cm) Weight: 81.6 kg (180 lb)  Insurance Information HMO:     PPO: yes     PCP:      IPA:      80/20:      OTHER:  PRIMARY: [place]      Policy#: [id number]      Subscriber: pt CM Name: [id number]     Phone#: [phone] main number     Fax#: [phone] approved for 10 days Pre-Cert#: [id number]      Employer: [employer] Benefits:  Phone #: [phone]     Name: 02/03/2016 Eff. Date: 01/16/16     Deduct: $200      Out of Pocket Max: $2000      Life Max: none CIR: 90%      SNF: 90% 60 days Outpatient: 90%     Co-Pay: 60 visits combined Home Health: 90%      Co-Pay: 120 visits DME: 90%     Co-Pay: 10% Providers: in netwrok  SECONDARY: none        Medicaid Application Date:       Case Manager:  Disability Application Date:       Case Worker:   Emergency Contact Information Contact Information    Name Relation Home Work Mobile   Brianna [name] Spouse [phone]        Current Medical History  Patient Admitting Diagnosis: bilateral femoral nerve injury  History of Present Illness:   HPI: Brianna [name] a 41 y.o.female admitted  on 11/28/16 at 40 5/7 weeks for IOL with elevated BP and 2+ proteinuria,  s/p delivery and s/p D&C who on postpartum day 1 started complaining of being unable to lift her legs and had difficulty weight bearing on BLE with sensory loss.  Patient with complicated delivery with legs in stirrups for extended period of time and epidural catheter. Neurology consulted for work up due to concerns of epidural hematoma v/s neuropathy. CT lumbar spine negative with negative coag studies. Dr. 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  Therapy ongoing and patient showing improvement in quadricepts movements but continues  to have functional deficits. CIR recommended for follow up therapy.      Patient's medical record from [facility] has been reviewed by the rehabilitation admission coordinator and physician. NIH Stroke scale: Glascow Coma Scale:  Past Medical History  Past Medical History:  Diagnosis Date  . Headache   . Newborn product of in vitro fertilization (IVF) pregnancy     Family History   family history includes Hyperlipidemia in her father and mother.  Prior Rehab/Hospitalizations Has the patient had major surgery during 100 days prior to admission? No    Current Medications Prenatal vitamins Labetalol ibuprofen  Patients Current Diet:  Regular diet with thin liquids  Precautions / Restrictions Precautions Precautions: Fall Precaution Comments: Trace quadricep strength bilaterally Other Brace/Splint: bil KI for gait.  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  Independent  Indoor Mobility: Did the patient need assistance with walking from room to room (with or  without device)? Independent  Stairs: Did the patient need assistance with internal or external stairs (with or without device)? Independent  Functional Cognition: Did the patient need help planning regular tasks such as shopping or remembering to take medications? Independent  Home Assistive Devices / Equipment Home Assistive Devices/Equipment: None Home Equipment: None  Prior Device Use: Indicate devices/aids used by the patient prior to current illness, exacerbation or injury? None of the above   Prior Functional Level Current Functional Level  Bed Mobility  Independent  Supervision to min assist   Transfers   Independent  Min assist sit to stand with bed elevated 6 to 8 inches with bilateral knee immobilizers. Much more assistance form normal surface height. Trace quadricep strenght bilaterally.   Mobility - Walk/Wheelchair  INdependent  Min assist + 2 safety/equipment 70 feet with RW. Decreased step length, decreased gait velocity. Knees buckling. Heavy reliance on UEs for support.   Upper Body Dressing  Independent  Setu[ to min assist   Lower Body Dressing  Independent  RLE sensation medial thigh; LLE sensation with decreased lith touch medial thigh. Mod assist   Grooming  Independent  Set up   Eating/Drinking  INdependent  Set up   Toilet Transfer  INdependent  Min to mod assist from raised bed 6 to 8 inches   Bladder Continence   Intact  Yes continent   Bowel Management  Intact  continent   Stair Climbing  INdependent  Not attempted   Communication  Independent  intatct   Memory  Intact  intact   Cooking/Meal Prep  Independent      Housework  Independent    Money Management  INdependent    Driving  Yes      Special needs/care consideration BiPAP/CPAP  N/a CPM  N/a Continuous Drip IV  N/a Dialysis  N/a Life Vest n/a Oxygen  N/a Special Bed  N/a Trach Size  N/a Wound Vac n/a Skin perineum incision with scant to minimal drainage and bleeding                              Bowel mgmt: continent LBM 12/02/16 Bladder mgmt: continent Diabetic mgmt  N/a Breast pump and infant bassinet to be provided as needed Pt's spouse to bring in infant as needed for breast feeding and bonding. Therapy schedule to accomodate these needs.  Previous Home Environment Living Arrangements: Spouse/significant other  Lives With: Spouse Available Help at Discharge: (spouse FMLA and pt's local Mother to assist) Type of Home: House Home Layout: Two level, Bed/bath upstairs, 1/2 bath on main level Alternate Level Stairs-Number of Steps: flight Home Access: Stairs to  enter Technical brewer of Steps: 1+1 Bathroom Shower/Tub: Multimedia programmer: Standard Bathroom Accessibility: Yes How Accessible: Accessible via walker [place]: No  Discharge Living Setting Plans for Discharge Living Setting: Patient's home, Lives with (comment)(spouse) Type of Home at Discharge: House Discharge Home Layout: 1/2 bath on main level, Bed/bath upstairs Discharge Home Access: Stairs to enter Entrance Stairs-Rails: None Entrance Stairs-Number of Steps: 1 + 1 Discharge Bathroom Shower/Tub: Horticulturist, commercial: Standard Discharge Bathroom Accessibility: Yes How Accessible: Accessible via walker Does the patient have any problems obtaining your medications?: No  Social/Family/Support Systems Patient Roles: Spouse, Parent(fulltime employee) Contact Information: Lanny Cramp, spouse Anticipated Caregiver: spouse and Mother Anticipated Caregiver's Contact Information: see above Ability/Limitations of Caregiver: spouse FMLA and can work from home Caregiver Availability: 24/7 Discharge Plan  Discussed with Primary Caregiver: Yes Is Caregiver In Agreement with Plan?: Yes Does Caregiver/Family have Issues with Lodging/Transportation while Pt is in Rehab?: No  Goals/Additional Needs Patient/Family Goal for Rehab: Mod I to supervision with PT and OT Expected length of stay: ELOS 7 to 10 days Equipment Needs: Breast pump; bassinet for infant when visitng Special Service Needs: therapy schedule to be set times as infant ability to come in to nurse frequently throughout the day adn night. Additional Information: Infant not to stay overnight. Spouse will bring her in for feedings(Breast mile to be stored as needed) Pt/Family Agrees to Admission and willing to participate: Yes Program Orientation Provided & Reviewed with Pt/Caregiver Including Roles  & Responsibilities: Yes  Patient Condition: I have reviewed all medical records  from [facility], spoke with RN case Manager and attending OB/GYN, Dr. Chauncey Fischer. Also met with patient and her spouse at bedside to discuss goals and expectations of an inpatient acute rehab admission. Patient will benefit from ongoing PT and OT. She will receive coordinated team apporach of therpay, Rehab MD, as well as Rehab Nursing. She is overall min to mod assist with mobility and adls.We will admit to Acute Inpatient Rehabiliation Today.  Preadmission Screen Completed By:  Cleatrice Burke, 12/04/2016 11:31 AM ______________________________________________________________________   Discussed status with Dr. Naaman Plummer on 12/04/2016  at  1308 and received telephone approval for admission today.  Admission Coordinator:  Cleatrice Burke, time  3662 Date  12/04/2016   Assessment/Plan: Diagnosis: bilateral femoral neuropathy 1. Does the need for close, 24 hr/day  Medical supervision in concert with the patient's rehab needs make it unreasonable for this patient to be served in a less intensive setting? Yes 2. Co-Morbidities requiring supervision/potential complications: post partum, pain mgt 3. Due to bladder management, bowel management, safety, skin/wound care, disease management, medication administration, pain management and patient education, does the patient require 24 hr/day rehab nursing? Yes 4. Does the patient require coordinated care of a physician, rehab nurse, PT (1-2 hrs/day, 5 days/week) and OT (1-2 hrs/day, 5 days/week) to address physical and functional deficits in the context of the above medical diagnosis(es)? Yes Addressing deficits in the following areas: balance, endurance, locomotion, strength, transferring, bowel/bladder control, bathing, dressing, feeding, grooming, toileting and psychosocial support 5. Can the patient actively participate in an intensive therapy program of at least 3 hrs of therapy 5 days a week? Yes 6. The potential for patient to make  measurable gains while on inpatient rehab is excellent 7. Anticipated functional outcomes upon discharge from inpatients are: modified independent and supervision PT, modified independent and supervision OT, n/a SLP 8. Estimated rehab length of stay to reach the above functional goals is: 7-10 days 9. Does the patient have adequate social supports to accommodate these discharge functional goals? Yes 10. Anticipated D/C setting: Home 11. Anticipated post D/C treatments: [place] therapy 12. Overall Rehab/Functional Prognosis: excellent    RECOMMENDATIONS: This patient's condition is appropriate for continued rehabilitative care in the following setting: CIR Patient has agreed to participate in recommended program. Yes Note that insurance prior authorization may be required for reimbursement for recommended care.  Comment: admit to inpatient rehab today  [name], MD, [facility] 12/04/2016   Cleatrice Burke 12/04/2016

## 2016-12-04 NOTE — Progress Notes (Signed)
Post Partum Day 6 Subjective: Pt doing well.  Baby went home last night and stayed with husband and pt's mother, she got good rest.  Minimal bleeding.   She is doing well with transfers to bedside commode.  She has quadriceps function returning slowly, able to kick leg a bit now when dangling, but obviously still very weak. Pumping going well.  Objective: Blood pressure (!) 123/47, pulse 69, temperature 98.2 F (36.8 C), temperature source Oral, resp. rate 18, height 5' 6"  (1.676 m), weight 81.6 kg (180 lb), last menstrual period 02/16/2016, SpO2 99 %, unknown if currently breastfeeding.  Physical Exam:  General: alert and cooperative Lochia: appropriate Uterine Fundus: firm at umbilicus -1 DVT Evaluation: No evidence of DVT seen on physical exam. She has quadriceps movement on both sides right.left and able to kick at a dangle  No results for input(s): HGB, HCT in the last 72 hours.  Assessment/Plan: Plan is transfer to inpatient rehab today pending insurance approval Will d/c from here on labetalol 287m po BID (should be able to wean in next few weeks) and ibuprofen prn Continue prenatal vitamins Plan f/u in office in 2-3 weeks if home by then to assess uterus for retained placenta unless has abnormal bleeding prior to that; d/w Dr. YKerin Pernawho did her IVF.  He did have to do two procedures on her uterus prior to transfer and will review records to see if was in fundus (?focal accreta)   LOS: 7 days   KLogan Bores11/20/2018, 8:26 AM

## 2016-12-04 NOTE — Progress Notes (Signed)
Discharge instructions provided, questions answered, pt states understanding. Pt signed and given copy

## 2016-12-04 NOTE — H&P (Signed)
Physical Medicine and Rehabilitation Admission H&P    CC: Functional deficits due to femoral nerve injury.  HPI: Brianna [name] a 41 y.o.female admitted  on 11/28/16 at 40 5/7 weeks for IOL with elevated BP and 2+ proteinuria,  s/p delivery and s/p D&C who on postpartum day 1 started complaining of being unable to lift her legs and had difficulty weight bearing on BLE with sensory loss.  Patient with complicated delivery with legs in stirrups for extended period of time and epidural catheter. Neurology consulted for work up due to concerns of epidural hematoma v/s neuropathy. CT lumbar spine negative with negative coag studies. Dr. 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  Therapy ongoing and patient showing improvement in quadricepts movements but continues to have functional deficits. CIR recommended for follow up therapy.    Review of Systems  Constitutional: Negative for chills and fever.  HENT: Negative for hearing loss and tinnitus.   Eyes: Negative for blurred vision and double vision.  Respiratory: Negative for cough and shortness of breath.   Cardiovascular: Negative for chest pain and palpitations.  Gastrointestinal: Negative for constipation, heartburn and nausea.  Genitourinary: Negative for dysuria and urgency.  Musculoskeletal: Negative for back pain, joint pain and myalgias.  Skin: Negative for rash.  Neurological: Positive for dizziness (at end of activity), sensory change (Carpel tunnel due to pregnancy. Numbness bilateral feed and medial aspect BLE) and focal weakness. Negative for headaches.  Psychiatric/Behavioral: The patient is not nervous/anxious and does not have insomnia.          Past Medical History:  Diagnosis Date  . Headache   . Newborn product of in vitro fertilization (IVF) pregnancy          Past Surgical History:  Procedure Laterality Date  . DILATION AND  CURETTAGE OF UTERUS N/A 11/28/2016   Procedure: DILATATION AND CURETTAGE;  Surgeon: Paula Compton, MD;  Location: [place];  Service: Gynecology;  Laterality: N/A;  . WISDOM TOOTH EXTRACTION    . WRIST FRACTURE SURGERY     r/wrist         Family History  Problem Relation Age of Onset  . Hyperlipidemia Mother   . Hyperlipidemia Father     Social History:  Married. Working as Haematologist. She was active PTA--was running till a couple of months PTA then had transitioned to elliptical and yoga. She reports that  has never smoked. she has never used smokeless tobacco. She reports that she was drinking a glass of wine daily prior to pregnancy. She reports that she does not use drugs.        Allergies  Allergen Reactions  . Sulfa Antibiotics Nausea And Vomiting   Medications Prior to Admission  Medication Sig Dispense Refill  . Calcium-Magnesium-Vitamin D 185-50-100 MG-MG-UNIT CAPS Take 1 tablet daily by mouth.    . Prenatal Vit-Fe Fumarate-FA (PRENATAL MULTIVITAMIN) TABS tablet Take 1 tablet daily at 12 noon by mouth.      Drug Regimen Review  Drug regimen was reviewed and remains appropriate with no significant issues identified  Home: Home Living Family/patient expects to be discharged to:: Private residence Living Arrangements: Spouse/significant other Available Help at Discharge: (spouse FMLA and pt's local Mother to assist) Type of Home: House Home Access: Stairs to enter Technical brewer of Steps: 1+1 Home Layout: Two level, Bed/bath upstairs, 1/2 bath on main level Alternate Level Stairs-Number of Steps: flight Bathroom Shower/Tub: Walk-in shower  Bathroom Toilet: Programmer, systems: Yes Home Equipment: None  Lives With: Spouse   Functional History: Prior Function Level of Independence: Independent 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[employer]:  Mobility: Bed Mobility Overal bed mobility:  Modified Independent Bed Mobility: Supine to Sit Supine to sit: Modified independent (Device/Increase time) Sit to supine: Modified independent (Device/Increase time) General bed mobility comments: Pt sitting EOB Transfers Overall transfer level: Needs assistance Equipment used: Rolling walker (2 wheeled) Transfers: Sit to/from Stand Sit to Stand: Mod assist Squat pivot transfers: Min assist, Mod assist, +2 safety/equipment  Lateral/Scoot Transfers: +2 physical assistance, Min assist General transfer comment: mod A to power up and control descent to bed Ambulation/Gait Ambulation/Gait assistance: Min assist, +2 safety/equipment Ambulation Distance (Feet): 70 Feet Assistive device: Rolling walker (2 wheeled) Gait Pattern/deviations: Step-to pattern, Decreased step length - right, Decreased step length - left General Gait Details: Verbal cues to push walker a little further ahead. Pt with bil knee immobilizers on to keep knees from buckling. Heavy reliance on UE's. Assist for support. Gait velocity: decreased Gait velocity interpretation: Below normal speed for age/gender  ADL: ADL Overall ADL's : Needs assistance/impaired Grooming: Set up, Sitting Upper Body Bathing: Set up, Sitting Lower Body Bathing: Minimal assistance, Bed level, Sitting/lateral leans Upper Body Dressing : Set up, Sitting Lower Body Dressing: Moderate assistance, Sitting/lateral leans, Bed level Toilet Transfer: Moderate assistance, BSC, RW Toileting- Clothing Manipulation and Hygiene: Moderate assistance, Sit to/from stand, Sitting/lateral lean Functional mobility during ADLs: Moderate assistance, Rolling walker General ADL Comments: Pt able to cross legs over knees in sitting to address LB ADL. Expressing concners over her role of caregiver  Cognition: Cognition Overall Cognitive Status: Within Functional Limits for tasks assessed Orientation Level: Oriented X4 Cognition Arousal/Alertness:  Awake/alert Behavior During Therapy: WFL for tasks assessed/performed Overall Cognitive Status: Within Functional Limits for tasks assessed  Physical Exam: Blood pressure 136/82, pulse 69, temperature 98.6 F (37 C), temperature source Oral, resp. rate 18, height 5' 6"  (1.676 m), weight 81.6 kg (180 lb), last menstrual period 02/16/2016, SpO2 100 %, unknown if currently breastfeeding. Physical Exam  Nursing note and vitals reviewed. Constitutional: She is oriented to person, place, and time. She appears well-developed and well-nourished.  HENT:  Head: Normocephalic and atraumatic.  Mouth/Throat: Oropharynx is clear and moist.  Eyes: Conjunctivae are normal. Pupils are equal, round, and reactive to light.  Neck: Normal range of motion. Neck supple.  Cardiovascular: Normal rate and regular rhythm. Exam reveals no gallop.  No murmur heard. Respiratory: Effort normal and breath sounds normal. No stridor. No respiratory distress. She has no wheezes. She has no rales.  GI: Soft. Bowel sounds are normal. She exhibits no distension. There is no tenderness.  Minimal tenderness.  Musculoskeletal: She exhibits no edema or tenderness.  Neurological: She is alert and oriented to person, place, and time.  Patient cognitively appropriate with normal insight and awareness as well as memory.  Upper extremity strength is 5 out of 5.  In lower extremities hip flexion grossly 4 out of 5.  Abduction of the hips is 4-5 out of 5.  Adduction is 3+ to 4 out of 5.  Knee extension 2+ to 3- out of 5 bilaterally.  Hamstrings are 4+ out of 5.  Ankle dorsiflexion and plantar flexion are 5 out of 5.  She has decreased sensation to light touch over the medial thigh knee and leg bilaterally.  Reflexes are absent in both knees  Skin: Skin is warm and dry.  Psychiatric: She has a normal mood and affect. Her behavior is normal. Judgment and thought content normal.    LabResultsLast48Hours  No results found for this or  any previous visit (from the past 48 hour(s)).   ImagingResults(Last48hours)  No results found.       Medical Problem List and Plan: 1.  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  DVT Prophylaxis/Anticoagulation: Mechanical: Sequential compression devices, below knee Bilateral lower extremities 3. Pain Management: oxycodone prn 4. Mood: LCSW to follow for evaluation and support.  5. Neuropsych: This patient is capable of making decisions on her own behalf. 6. Skin/Wound Care: routine pressure relief measures 7. Fluids/Electrolytes/Nutrition: Monitor I/O. Check lytes in am.  8.  ABLA: Will continue to monitor. Add iron supplement.  9. AKI: Improving--encourage fluid intake.  10. Thrombocytopenia: Resolving. Monitor for recovery post delivery/.     Post Admission Physician Evaluation: 1. Functional deficits secondary  to bilateral femoral nerve versus posterior division lumbar plexus injury. 2. Patient is admitted to receive collaborative, interdisciplinary care between the physiatrist, rehab nursing staff, and therapy team. 3. Patient's level of medical complexity and substantial therapy needs in context of that medical necessity cannot be provided at a lesser intensity of care such as a SNF. 4. Patient has experienced substantial functional loss from his/her baseline which was documented above under the "Functional History" and "Functional Status" headings.  Judging by the patient's diagnosis, physical exam, and functional history, the patient has potential for functional progress which will result in measurable gains while on inpatient rehab.  These gains will be of substantial and practical use upon discharge  in facilitating mobility and self-care at the household level. 5. Physiatrist will provide 24 hour management of medical needs as well as oversight  of the therapy plan/treatment and provide guidance as appropriate regarding the interaction of the two. 6. The Preadmission Screening has been reviewed and patient status is unchanged unless otherwise stated above. 7. 24 hour rehab nursing will assist with bladder management, bowel management, safety, skin/wound care, disease management, medication administration, pain management and patient education  and help integrate therapy concepts, techniques,education, etc. 8. PT will assess and treat for/with: Lower extremity strength, range of motion, stamina, balance, functional mobility, safety, adaptive techniques and equipment, neuromuscular reeducation, pain control, orthotics.   Goals are: mod I. 9. OT will assess and treat for/with: ADL's, functional mobility, safety, upper extremity strength, adaptive techniques and equipment, community reentry, ego support, orthotic use.   Goals are: mod I. Therapy may proceed with showering this patient. 10. SLP will assess and treat for/with: n/a.  Goals are: n/a. 11. Case Management and Social Worker will assess and treat for psychological issues and discharge planning. 12. Team conference will be held weekly to assess progress toward goals and to determine barriers to discharge. 13. Patient will receive at least 3 hours of therapy per day at least 5 days per week. 14. ELOS: 7-10 days       15. Prognosis:  excellent     [name], MD, [facility] 12/04/2016  Bary Leriche, Hershal Coria 12/04/2016

## 2016-12-04 NOTE — Progress Notes (Signed)
[name], MD  Physician  Physical Medicine and Rehabilitation  PMR Pre-admission  Signed  Date of Service:  12/04/2016 11:30 AM       Related encounter: Admission (Discharged) from 11/27/2016 in [place]           _0 Hide copied text  _1 Hover for details     [employer] PMR Admission Coordinator Pre-Admission Assessment  Patient: Brianna [name] is an 41 y.o., female MRN: [id number] DOB: [birth year] Height: _2  (167.6 cm) Weight: 81.6 kg (180 lb)  Insurance Information HMO:     PPO: yes     PCP:      IPA:      80/20:      OTHER:  PRIMARY: [place]      Policy#: [id number]      Subscriber: pt CM Name: [phone]     Phone#: [phone] main number     Fax#: [phone] approved for 10 days Pre-Cert#: [id number]      Employer: [employer] Benefits:  Phone #: [phone]     Name: 02/03/2016 Eff. Date: 01/16/16     Deduct: $200      Out of Pocket Max: $2000      Life Max: none CIR: 90%      SNF: 90% 60 days Outpatient: 90%     Co-Pay: 60 visits combined Home Health: 90%      Co-Pay: 120 visits DME: 90%     Co-Pay: 10% Providers: in netwrok  SECONDARY: none        Medicaid Application Date:       Case Manager:  Disability Application Date:       Case Worker:   Emergency Contact Information        Contact Information    Name Relation Home Work Mobile   Wilson,Chris Spouse [phone]        Current Medical History  Patient Admitting Diagnosis: bilateral femoral nerve injury  History of Present Illness:   GYI:RSWNIOE Cageis a 41 y.o.femaleadmitted on 11/28/16 at 40 5/7 weeks for IOL with elevated BP and 2+ proteinuria, s/p delivery and s/p D&C whoon postpartum day 1started complaining of being unable to lift her legs and had difficulty weight bearing on BLE with sensory loss. Patient with complicated delivery with legs in stirrups for extended period of time and epidural catheter.  Neurology consulted for work up due to concerns of epidural hematoma v/s neuropathy. CT lumbar spine negative with negative coag studies. Dr. 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 Therapy ongoing and patient showing improvement in quadricepts movements but continues to have functional deficits. CIR recommended for follow up therapy.      Patient's medical record from [facility] has been reviewed by the rehabilitation admission coordinator and physician. NIH Stroke scale: Glascow Coma Scale:  Past Medical History      Past Medical History:  Diagnosis Date  . Headache   . Newborn product of in vitro fertilization (IVF) pregnancy     Family History   family history includes Hyperlipidemia in her father and mother.  Prior Rehab/Hospitalizations Has the patient had major surgery during 100 days prior to admission? No               Current Medications Prenatal vitamins Labetalol ibuprofen  Patients Current Diet:  Regular diet with thin liquids  Precautions / Restrictions Precautions Precautions: Fall Precaution Comments: Trace quadricep  strength bilaterally Other Brace/Splint: bil KI for gait.  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  Independent  Indoor Mobility: Did the patient need assistance with walking from room to room (with or without device)? Independent  Stairs: Did the patient need assistance with internal or external stairs (with or without device)? Independent  Functional Cognition: Did the patient need help planning regular tasks such as shopping or remembering to take  medications? Independent  Home Assistive Devices / Equipment Home Assistive Devices/Equipment: None Home Equipment: None  Prior Device Use: Indicate devices/aids used by the patient prior to current illness, exacerbation or injury? None of the above   Prior Functional Level Current Functional Level  Bed Mobility  Independent  Supervision to min assist   Transfers  Independent  Min assist sit to stand with bed elevated 6 to 8 inches with bilateral knee immobilizers. Much more assistance form normal surface height. Trace quadricep strenght bilaterally.   Mobility - Walk/Wheelchair  INdependent  Min assist + 2 safety/equipment 70 feet with RW. Decreased step length, decreased gait velocity. Knees buckling. Heavy reliance on UEs for support.   Upper Body Dressing  Independent  Setu[ to min assist   Lower Body Dressing  Independent  RLE sensation medial thigh; LLE sensation with decreased lith touch medial thigh. Mod assist   Grooming  Independent  Set up   Eating/Drinking  INdependent  Set up   Toilet Transfer  INdependent  Min to mod assist from raised bed 6 to 8 inches   Bladder Continence   Intact  Yes continent   Bowel Management  Intact  continent   Stair Climbing  INdependent  Not attempted   Communication  Independent  intatct   Memory  Intact  intact   Cooking/Meal Prep  Independent      Housework  Independent    Money Management  INdependent    Driving  Yes      Special needs/care consideration BiPAP/CPAP  N/a CPM  N/a Continuous Drip IV  N/a Dialysis  N/a Life Vest n/a Oxygen  N/a Special Bed  N/a Trach Size  N/a Wound Vac n/a Skin perineum incision with scant to minimal drainage and bleeding                              Bowel mgmt: continent LBM 12/02/16 Bladder mgmt: continent Diabetic mgmt  N/a Breast pump and infant bassinet to be provided as needed Pt's spouse  to bring in infant as needed for breast feeding and bonding. Therapy schedule to accomodate these needs.  Previous Home Environment Living Arrangements: Spouse/significant other  Lives With: Spouse Available Help at Discharge: (spouse FMLA and pt's local Mother to assist) Type of Home: House Home Layout: Two level, Bed/bath upstairs, 1/2 bath on main level Alternate Level Stairs-Number of Steps: flight Home Access: Stairs to enter Technical brewer of Steps: 1+1 Bathroom Shower/Tub: Multimedia programmer: Standard Bathroom Accessibility: Yes How Accessible: Accessible via walker Delta: No  Discharge Living Setting Plans for Discharge Living Setting: Patient's home, Lives with (comment)(spouse) Type of Home at Discharge: House Discharge Home Layout: 1/2 bath on main level, Bed/bath upstairs Discharge  Home Access: Stairs to enter Entrance Stairs-Rails: None Entrance Stairs-Number of Steps: 1 + 1 Discharge Bathroom Shower/Tub: Horticulturist, commercial: Standard Discharge Bathroom Accessibility: Yes How Accessible: Accessible via walker Does the patient have any problems obtaining your medications?: No  Social/Family/Support Systems Patient Roles: Spouse, Parent(fulltime employee) Contact Information: Lanny Cramp, spouse Anticipated Caregiver: spouse and Mother Anticipated Caregiver's Contact Information: see above Ability/Limitations of Caregiver: spouse FMLA and can work from home Caregiver Availability: 24/7 Discharge Plan Discussed with Primary Caregiver: Yes Is Caregiver In Agreement with Plan?: Yes Does Caregiver/Family have Issues with Lodging/Transportation while Pt is in Rehab?: No  Goals/Additional Needs Patient/Family Goal for Rehab: Mod I to supervision with PT and OT Expected length of stay: ELOS 7 to 10 days Equipment Needs: Breast pump; bassinet for infant when visitng Special Service Needs: therapy schedule to be  set times as infant ability to come in to nurse frequently throughout the day adn night. Additional Information: Infant not to stay overnight. Spouse will bring her in for feedings(Breast mile to be stored as needed) Pt/Family Agrees to Admission and willing to participate: Yes Program Orientation Provided & Reviewed with Pt/Caregiver Including Roles  & Responsibilities: Yes  Patient Condition: I have reviewed all medical records from [facility], spoke with RN case Manager and attending OB/GYN, Dr. Chauncey Fischer. Also met with patient and her spouse at bedside to discuss goals and expectations of an inpatient acute rehab admission. Patient will benefit from ongoing PT and OT. She will receive coordinated team apporach of therpay, Rehab MD, as well as Rehab Nursing. She is overall min to mod assist with mobility and adls.We will admit to Acute Inpatient Rehabiliation Today.  Preadmission Screen Completed By:  Cleatrice Burke, 12/04/2016 11:31 AM ______________________________________________________________________   Discussed status with Dr. Naaman Plummer on 12/04/2016  at  1308 and received telephone approval for admission today.  Admission Coordinator:  Cleatrice Burke, time  7591 Date  12/04/2016   Assessment/Plan: Diagnosis: bilateral femoral neuropathy 1. Does the need for close, 24 hr/day  Medical supervision in concert with the patient's rehab needs make it unreasonable for this patient to be served in a less intensive setting? Yes 2. Co-Morbidities requiring supervision/potential complications: post partum, pain mgt 3. Due to bladder management, bowel management, safety, skin/wound care, disease management, medication administration, pain management and patient education, does the patient require 24 hr/day rehab nursing? Yes 4. Does the patient require coordinated care of a physician, rehab nurse, PT (1-2 hrs/day, 5 days/week) and OT (1-2 hrs/day, 5 days/week) to address  physical and functional deficits in the context of the above medical diagnosis(es)? Yes Addressing deficits in the following areas: balance, endurance, locomotion, strength, transferring, bowel/bladder control, bathing, dressing, feeding, grooming, toileting and psychosocial support 5. Can the patient actively participate in an intensive therapy program of at least 3 hrs of therapy 5 days a week? Yes 6. The potential for patient to make measurable gains while on inpatient rehab is excellent 7. Anticipated functional outcomes upon discharge from inpatients are: modified independent and supervision PT, modified independent and supervision OT, n/a SLP 8. Estimated rehab length of stay to reach the above functional goals is: 7-10 days 9. Does the patient have adequate social supports to accommodate these discharge functional goals? Yes 10. Anticipated D/C setting: Home 11. Anticipated post D/C treatments: [facility] therapy 12. Overall Rehab/Functional Prognosis: excellent    RECOMMENDATIONS: This patient's condition is appropriate for continued rehabilitative care in the following setting: CIR Patient has agreed to participate  in recommended program. Yes Note that insurance prior authorization may be required for reimbursement for recommended care.  Comment: admit to inpatient rehab today  [name], MD, [facility] 12/04/2016   Cleatrice Burke 12/04/2016          Revision History

## 2016-12-05 ENCOUNTER — Inpatient Hospital Stay (HOSPITAL_COMMUNITY): Payer: BLUE CROSS/BLUE SHIELD | Admitting: Physical Therapy

## 2016-12-05 ENCOUNTER — Inpatient Hospital Stay (HOSPITAL_COMMUNITY): Payer: BLUE CROSS/BLUE SHIELD

## 2016-12-05 ENCOUNTER — Encounter (HOSPITAL_COMMUNITY): Payer: BLUE CROSS/BLUE SHIELD

## 2016-12-05 ENCOUNTER — Inpatient Hospital Stay (HOSPITAL_COMMUNITY): Payer: BLUE CROSS/BLUE SHIELD | Admitting: Occupational Therapy

## 2016-12-05 DIAGNOSIS — N179 Acute kidney failure, unspecified: Secondary | ICD-10-CM | POA: Diagnosis present

## 2016-12-05 DIAGNOSIS — D62 Acute posthemorrhagic anemia: Secondary | ICD-10-CM

## 2016-12-05 DIAGNOSIS — I10 Essential (primary) hypertension: Secondary | ICD-10-CM

## 2016-12-05 LAB — CBC WITH DIFFERENTIAL/PLATELET
BASOS ABS: 0 10*3/uL (ref 0.0–0.1)
BASOS PCT: 0 %
EOS PCT: 5 %
Eosinophils Absolute: 0.3 10*3/uL (ref 0.0–0.7)
HEMATOCRIT: 29.3 % — AB (ref 36.0–46.0)
Hemoglobin: 10 g/dL — ABNORMAL LOW (ref 12.0–15.0)
Lymphocytes Relative: 25 %
Lymphs Abs: 1.6 10*3/uL (ref 0.7–4.0)
MCH: 31.7 pg (ref 26.0–34.0)
MCHC: 34.1 g/dL (ref 30.0–36.0)
MCV: 93 fL (ref 78.0–100.0)
MONO ABS: 0.4 10*3/uL (ref 0.1–1.0)
Monocytes Relative: 5 %
NEUTROS ABS: 4.2 10*3/uL (ref 1.7–7.7)
Neutrophils Relative %: 65 %
Platelets: 217 10*3/uL (ref 150–400)
RBC: 3.15 MIL/uL — AB (ref 3.87–5.11)
RDW: 13.7 % (ref 11.5–15.5)
WBC: 6.5 10*3/uL (ref 4.0–10.5)

## 2016-12-05 LAB — COMPREHENSIVE METABOLIC PANEL
ALBUMIN: 2.9 g/dL — AB (ref 3.5–5.0)
ALK PHOS: 96 U/L (ref 38–126)
ALT: 35 U/L (ref 14–54)
AST: 32 U/L (ref 15–41)
Anion gap: 6 (ref 5–15)
BILIRUBIN TOTAL: 0.8 mg/dL (ref 0.3–1.2)
BUN: 17 mg/dL (ref 6–20)
CALCIUM: 8.8 mg/dL — AB (ref 8.9–10.3)
CO2: 23 mmol/L (ref 22–32)
Chloride: 107 mmol/L (ref 101–111)
Creatinine, Ser: 0.81 mg/dL (ref 0.44–1.00)
GFR calc Af Amer: >60 mL/min (ref >60)
GFR calc non Af Amer: >60 mL/min (ref >60)
GLUCOSE: 83 mg/dL (ref 65–99)
Potassium: 4 mmol/L (ref 3.5–5.1)
Sodium: 136 mmol/L (ref 135–145)
TOTAL PROTEIN: 5.7 g/dL — AB (ref 6.5–8.1)

## 2016-12-05 MED ORDER — LABETALOL HCL 200 MG PO TABS
200.0000 mg | ORAL_TABLET | Freq: Two times a day (BID) | ORAL | Status: DC
  Administered 2016-12-05 – 2016-12-08 (×7): 200 mg via ORAL
  Filled 2016-12-05 (×7): qty 1

## 2016-12-05 NOTE — IPOC Note (Signed)
Overall Plan of Care [facility]) Patient Details Name: Brianna [name] MRN: [id number] DOB: 25-Dec-1975  Admitting Diagnosis: Bilateral femoral nerve injury  Hospital Problems: Active Problems:   Injury of femoral nerve   AKI (acute kidney injury) ([place])   Acute blood loss anemia   Benign essential HTN     Functional Problem List: Nursing Nutrition, Edema, Endurance, Sensory, Medication Management  PT Balance, Endurance, Motor, Sensory  OT Balance, Motor  SLP    TR         Basic ADL's: OT Bathing, Dressing, Toileting     Advanced  ADL's: OT       Transfers: PT Car, Bed Mobility, Furniture, Bed to Optician, dispensing, Agricultural engineer: PT Emergency planning/management officer, Stairs, Ambulation     Additional Impairments: OT None  SLP        TR      Anticipated Outcomes Item Anticipated Outcome  Self Feeding no goal  Swallowing      Basic self-care  mod I  Toileting  mod I    Bathroom Transfers mod I  Bowel/Bladder  manage bowel with mod I assist, manage bladder independently  Transfers  mod I  Locomotion  mod I  Communication     Cognition     Pain  pain managed with  prn motrin at or below level 3  Safety/Judgment      Therapy Plan: PT Intensity: Minimum of 1-2 x/day ,45 to 90 minutes PT Frequency: 5 out of 7 days PT Duration Estimated Length of Stay: 5-7 days OT Intensity: Minimum of 1-2 x/day, 45 to 90 minutes OT Frequency: 5 out of 7 days OT Duration/Estimated Length of Stay: 5-7 days      Team Interventions: Nursing Interventions Patient/Family Education, Discharge Planning, Pain Management, Medication Management, Psychosocial Support  PT interventions Ambulation/gait training, Community reintegration, DME/adaptive equipment instruction, Neuromuscular re-education, Stair training, UE/LE Strength taining/ROM, Wheelchair propulsion/positioning, UE/LE Coordination activities, Therapeutic Activities, Pain management, Functional electrical stimulation,  Discharge planning, Training and development officer, Functional mobility training, Patient/family education, Therapeutic Exercise, Splinting/orthotics  OT Interventions Balance/vestibular training, Discharge planning, DME/adaptive equipment instruction, Neuromuscular re-education, Functional mobility training, Self Care/advanced ADL retraining, Therapeutic Activities, UE/LE Coordination activities, Therapeutic Exercise, UE/LE Strength taining/ROM  SLP Interventions    TR Interventions    SW/CM Interventions Discharge Planning, Psychosocial Support, Patient/Family Education   Barriers to Discharge MD  Medical stability  Nursing      PT Inaccessible home environment 2 story home  OT      SLP      SW       Team Discharge Planning: Destination: PT-Home ,OT- Home , SLP-  Projected Follow-up: PT-Home health PT, OT-  None, SLP-  Projected Equipment Needs: PT-Rolling walker with 5" wheels, Wheelchair (measurements), Wheelchair cushion (measurements), OT- 3 in 1 bedside comode, Tub/shower seat, SLP-  Equipment Details: PT-18x18, OT-  Patient/family involved in discharge planning: PT- Patient,  OT-Patient, SLP-   MD ELOS: 5-7 days. Medical Rehab Prognosis:  Excellent Assessment: 41 y.o.femaleadmitted on 11/28/16 at 40 5/7 weeks for IOL with elevated BP and 2+ proteinuria, s/p delivery and s/p D&C whoon postpartum day 1started complaining of being unable to lift her legs and had difficulty weight bearing on BLE with sensory loss. Patient with complicated delivery with legs in stirrups for extended period of time and epidural catheter. Neurology consulted for work up due to concerns of epidural hematoma v/s neuropathy. CT lumbar spine negative with negative coag studies. Dr. Leonel Ramsay felt that symptoms due  to bilateral femoral neuropathy and should have gradual improvement with therapy. Therapy ongoing and patient showing improvement in quadricepts movements but continues to have functional  deficits. Will set goals for Mod I with PT/OT.   See Team Conference Notes for weekly updates to the plan of care

## 2016-12-05 NOTE — Evaluation (Signed)
Occupational Therapy Assessment and Plan  Patient Details  Name: Brianna [name] MRN: [id number] Date of Birth: Oct 03, 1975  OT Diagnosis: muscle weakness (generalized) Rehab Potential: Rehab Potential (ACUTE ONLY): Excellent ELOS: 5-7 days   Today's Date: 12/05/2016 OT Individual Time: 3295-1884 OT Individual Time Calculation (min): 70 min     Problem List:  Patient Active Problem List   Diagnosis Date Noted  . AKI (acute kidney injury) ([place])   . Acute blood loss anemia   . Benign essential HTN   . 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 Status post vacuum-assisted vaginal delivery 11/28/2016  . Pregnancy 11/27/2016  . Stress fracture of metatarsal bone of left foot 10/11/2016  . Low back pain 05/04/2015  . Bilateral knee pain 05/04/2015  . Plantar fasciitis, bilateral 10/13/2014  . ABNORMALITY OF GAIT 11/02/2008    Past Medical History:  Past Medical History:  Diagnosis Date  . Achilles tendinitis   . Headache   . Knee pain   . Newborn product of in vitro fertilization (IVF) pregnancy   . Plantar fasciitis    Past Surgical History:  Past Surgical History:  Procedure Laterality Date  . DILATION AND CURETTAGE OF UTERUS N/A 11/28/2016   Procedure: DILATATION AND CURETTAGE;  Surgeon: Paula Compton, MD;  Location: [facility];  Service: Gynecology;  Laterality: N/A;  . WISDOM TOOTH EXTRACTION    . WRIST FRACTURE SURGERY     r/wrist    Assessment & Plan Clinical Impression: Brianna [name] is a 41 y.o. female admitted  on 11/28/16 at 40 5/7 weeks for IOL with elevated BP and 2+ proteinuria,  s/p delivery and s/p D&C who on postpartum day 1 started complaining of being unable to lift her legs and had difficulty weight bearing on BLE with sensory loss.  Patient with complicated delivery with legs in stirrups for extended period of time and epidural catheter. Neurology consulted for work up due to concerns of epidural hematoma v/s neuropathy. CT lumbar spine negative with  negative coag studies. Dr. 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  Therapy ongoing and patient showing improvement in quadricepts movements but continues to have functional deficits. CIR recommended for follow up therapy.  Patient transferred to CIR on 12/04/2016 .    Patient currently requires supervision with basic self-care skills secondary to muscle weakness and decreased standing balance.  Prior to hospitalization, patient was fully independent and working full time.  Patient will benefit from skilled intervention to increase independence with basic self-care skills prior to discharge home with care partner.  Anticipate patient will require intermittent A with IADLs of cooking/ homemaking and no further OT follow recommended.  OT - End of Session Activity Tolerance: Endurance does not limit participation in activity OT Assessment Rehab Potential (ACUTE ONLY): Excellent OT Patient demonstrates impairments in the following area(s): Balance;Motor OT Basic ADL's Functional Problem(s): Bathing;Dressing;Toileting OT Transfers Functional Problem(s): Tub/Shower;Toilet OT Additional Impairment(s): None OT Plan OT Intensity: Minimum of 1-2 x/day, 45 to 90 minutes OT Frequency: 5 out of 7 days OT Duration/Estimated Length of Stay: 5-7 days OT Treatment/Interventions: Balance/vestibular training;Discharge planning;DME/adaptive equipment instruction;Neuromuscular re-education;Functional mobility training;Self Care/advanced ADL retraining;Therapeutic Activities;UE/LE Coordination activities;Therapeutic Exercise;UE/LE Strength taining/ROM OT Self Feeding Anticipated Outcome(s): no goal OT Basic Self-Care Anticipated Outcome(s): mod I OT Toileting Anticipated Outcome(s): mod I  OT Bathroom Transfers Anticipated Outcome(s): mod I OT Recommendation Patient destination: Home Follow Up Recommendations: None Equipment  Recommended: 3 in 1 bedside comode;Tub/shower seat  Skilled Therapeutic Intervention Pt seen for initial evaluation and ADL training.  Pt received in bed and was able to come to EOB with no A.  Demonstrated how to don KI and pt was able to don without A. She completed sit to stand with min cues for hand placement and then comes to stand with S with RW. She ambulated to toilet and used elevated seat of BSC to A her with sit to stand as she cannot bend her knees. Pt transferred to shower and bathed with set up.  redonned RI to ambulate to bed. Pt completed dressing from bed with set up. Pt in room with all needs met.   OT Evaluation Precautions/Restrictions  Precautions Precautions: Fall Precaution Comments: Trace quadricep strength bilaterally Required Braces or Orthoses: Knee Immobilizer - Right;Knee Immobilizer - Left Other Brace/Splint: bil KI for gait.  Restrictions Weight Bearing Restrictions: No   Pain Pain Assessment Pain Assessment: No/denies pain Home Living/Prior Functioning Home Living Available Help at Discharge: Family Type of Home: House Home Access: Stairs to enter Technical brewer of Steps: 1+1 Home Layout: Two level, Bed/bath upstairs, 1/2 bath on main level(she has a walk in shower on main level) Bathroom Shower/Tub: Multimedia programmer: Standard  Lives With: Spouse Prior Function Level of Independence: Independent with basic ADLs, Independent with homemaking with ambulation, Independent with gait, Independent with transfers  Able to Take Stairs?: Yes Driving: Yes Vocation: Full time employment 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[employer] ADL ADL ADL Comments: refer to functional navigator Vision Baseline Vision/History: Wears glasses Vision Assessment?: No apparent visual deficits Perception  Perception: Within Functional Limits Praxis Praxis: Intact Cognition Overall Cognitive Status: Within Functional Limits for tasks  assessed Arousal/Alertness: Awake/alert Orientation Level: Person;Place;Situation Person: Oriented Place: Oriented Situation: Oriented Year: 2018 Month: November Day of Week: Correct Memory: Appears intact Immediate Memory Recall: Sock;Blue;Bed Memory Recall: Sock;Blue;Bed Memory Recall Sock: Without Cue Memory Recall Blue: Without Cue Memory Recall Bed: Without Cue Sensation Sensation Light Touch: Impaired by gross assessment(BLE) Stereognosis: Appears Intact Hot/Cold: Appears Intact Proprioception: Appears Intact Coordination Fine Motor Movements are Fluid and Coordinated: Yes Motor  Motor Motor - Skilled Clinical Observations: LE quadriceps weakness Mobility    S with RW and B KI to ambulate in room Trunk/Postural Assessment  Cervical Assessment Cervical Assessment: Within Functional Limits Thoracic Assessment Thoracic Assessment: Within Functional Limits Lumbar Assessment Lumbar Assessment: Within Functional Limits Postural Control Postural Control: Within Functional Limits  Balance Dynamic Standing Balance Dynamic Standing - Level of Assistance: 5: Stand by assistance Extremity/Trunk Assessment RUE Assessment RUE Assessment: Within Functional Limits LUE Assessment LUE Assessment: Within Functional Limits   See Function Navigator for Current Functional Status.   Refer to Care Plan for Long Term Goals  Recommendations for other services: None    Discharge Criteria: Patient will be discharged from OT if patient refuses treatment 3 consecutive times without medical reason, if treatment goals not met, if there is a change in medical status, if patient makes no progress towards goals or if patient is discharged from hospital.  The above assessment, treatment plan, treatment alternatives and goals were discussed and mutually agreed upon: by patient  [facility] 12/05/2016, 12:33 PM

## 2016-12-05 NOTE — Progress Notes (Signed)
Physical Therapy Session Note  Patient Details  Name: Brianna [name] MRN: [id number] Date of Birth: [birth year]  Today's Date: 12/05/2016 PT Individual Time: 1415-1445 PT Individual Time Calculation (min): 30 min    Skilled Therapeutic Interventions/Progress Updates: Pt presented in bed agreeable to therapy. Pt able to donn braces mod I and performed stand pivot transfer to w/c supervision. Pt transported to ortho gym for time management and performed car transfer at supervision level and increased time. Pt participated in supine hip/core therex including bridges with ab draw-ins, hip er with level 2 resistance band, and standing hip abd/add all performed 2 x 10. Pt transported back to room and returned to bed in same manner as prior with needs met.  Therapy Documentation Precautions:  Precautions Precautions: Fall Precaution Comments: Trace quadricep strength bilaterally Required Braces or Orthoses: Knee Immobilizer - Right, Knee Immobilizer - Left Other Brace/Splint: bil KI for gait.  Restrictions Weight Bearing Restrictions: No General:   Vital Signs: Therapy Vitals Temp: 98.1 F (36.7 C) Temp Source: Oral Pulse Rate: 70 Resp: 16 BP: (!) 154/75 Patient Position (if appropriate): Sitting Oxygen Therapy SpO2: 99 % O2 Device: Not Delivered Pain: Pain Assessment Pain Assessment: No/denies pain Mobility:   Locomotion :    Trunk/Postural Assessment : Cervical Assessment Cervical Assessment: Within Functional Limits Thoracic Assessment Thoracic Assessment: Within Functional Limits Lumbar Assessment Lumbar Assessment: Within Functional Limits Postural Control Postural Control: Within Functional Limits  Balance: Dynamic Standing Balance Dynamic Standing - Level of Assistance: 5: Stand by assistance Exercises:   Other Treatments:     See Function Navigator for Current Functional Status.   Therapy/Group: Individual Therapy  Jessicalynn Deshong  Selig [place],  PTA  12/05/2016, 4:15 PM

## 2016-12-05 NOTE — Progress Notes (Addendum)
Physical Therapy Note  Patient Details  Name: Brianna [name] MRN: [id number] Date of Birth: 23-Dec-1975 Today's Date: 12/05/2016  1530-1630, 60 min individual tx Pain: none per pt  Supine exs:10 x 1  R/L quad/glut sets, active assistive R/L straight leg raises, bil bridging, resisted descent from bridging, 10 x 2 resisted PF. . R/L side lying: 10 x 1  bil active assistive knee extension/flexion,active assistive bil terminal knee extensions,  clam shells for hip abduction, straight leg hip abduction. Pt stated that she sleeps in fetal position and wakes up with bil knees cold and numb; PT advised her to avoid this position.  Pt donned bil KIs in long sitting, with extra time for orientation. Gait training with RW with supervision on level tile and carpet x 150'. Pt left resting in recliner with all needs at hand.  See function navigator for current status.   Izack Hoogland 12/05/2016, 3:46 PM

## 2016-12-05 NOTE — Evaluation (Signed)
Physical Therapy Assessment and Plan  Patient Details  Name: Brianna [name] MRN: [id number] Date of Birth: Oct 28, 1975  PT Diagnosis: Abnormality of gait, Difficulty walking, Impaired sensation and Muscle weakness Rehab Potential: Good ELOS: 5-7 days   Today's Date: 12/05/2016 PT Individual Time: 1100-1200 PT Individual Time Calculation (min): 60 min    Problem List:  Patient Active Problem List   Diagnosis Date Noted  . AKI (acute kidney injury) ([place])   . Acute blood loss anemia   . Benign essential HTN   . 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 Status post vacuum-assisted vaginal delivery 11/28/2016  . Pregnancy 11/27/2016  . Stress fracture of metatarsal bone of left foot 10/11/2016  . Low back pain 05/04/2015  . Bilateral knee pain 05/04/2015  . Plantar fasciitis, bilateral 10/13/2014  . ABNORMALITY OF GAIT 11/02/2008    Past Medical History:  Past Medical History:  Diagnosis Date  . Achilles tendinitis   . Headache   . Knee pain   . Newborn product of in vitro fertilization (IVF) pregnancy   . Plantar fasciitis    Past Surgical History:  Past Surgical History:  Procedure Laterality Date  . DILATION AND CURETTAGE OF UTERUS N/A 11/28/2016   Procedure: DILATATION AND CURETTAGE;  Surgeon: Paula Compton, MD;  Location: [facility];  Service: Gynecology;  Laterality: N/A;  . WISDOM TOOTH EXTRACTION    . WRIST FRACTURE SURGERY     r/wrist    Assessment & Plan Patient is a 41 y.o. year old female with recent admission to the hospital on 11/28/16 at 40 5/7 weeks for IOL with elevated BP and 2+ proteinuria, s/p delivery and s/p D&C whoon postpartum day 1started complaining of being unable to lift her legs and had difficulty weight bearing on BLE with sensory loss. Patient with complicated delivery with legs in stirrups for extended period of time and epidural catheter. Neurology consulted for work up due to concerns of epidural hematoma v/s neuropathy. CT  lumbar spine negative with negative coag studies. Dr. 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  Patient transferred to CIR on 12/04/2016 .   Patient currently requires supervision with mobility secondary to muscle weakness and decreased standing balance and decreased balance strategies.  Prior to hospitalization, patient was independent  with mobility and lived with Spouse in a House home.  Home access is 1+1Stairs to enter.  Patient will benefit from skilled PT intervention to maximize safe functional mobility, minimize fall risk and decrease caregiver burden for planned discharge home with intermittent assist.  Anticipate patient will benefit from follow up [facility] at discharge.  PT - End of Session Activity Tolerance: Tolerates 30+ min activity with multiple rests PT Assessment Rehab Potential (ACUTE/IP ONLY): Good PT Barriers to Discharge: Inaccessible home environment PT Barriers to Discharge Comments: 2 story home PT Patient demonstrates impairments in the following area(s): Balance;Endurance;Motor;Sensory PT Transfers Functional Problem(s): Car;Bed Mobility;Furniture;Bed to Chair PT Locomotion Functional Problem(s): Wheelchair Mobility;Stairs;Ambulation PT Plan PT Intensity: Minimum of 1-2 x/day ,45 to 90 minutes PT Frequency: 5 out of 7 days PT Duration Estimated Length of Stay: 5-7 days PT Treatment/Interventions: Ambulation/gait training;Community reintegration;DME/adaptive equipment instruction;Neuromuscular re-education;Stair training;UE/LE Strength taining/ROM;Wheelchair propulsion/positioning;UE/LE Coordination activities;Therapeutic Activities;Pain management;Functional electrical stimulation;Discharge planning;Balance/vestibular training;Functional mobility training;Patient/family education;Therapeutic Exercise;Splinting/orthotics PT Transfers Anticipated Outcome(s): mod I PT Locomotion Anticipated  Outcome(s): mod I PT Recommendation Follow Up Recommendations: Home health PT Patient destination: Home Equipment Recommended: Rolling walker with 5" wheels;Wheelchair (measurements);Wheelchair cushion (measurements) Equipment  Details: 18x18  Skilled Therapeutic Intervention Pt participated in skilled PT eval and was educated on PT POC and goals.  Pt performed gait with RW and bilat KI with supervision 100' x 2.  Transfers stand pivot with RW with supervision, squat pivot with supervision.  Supine NMR with tapping for activation of quads with SAQ 3 x 30 bilat.  Standing without KI with supervision, pt relies heavily on UEs due to fear of falling without KIs on.   W/c mobility with bilat LEs to simulate using UEs to hold newborn daughter. Pt able to propel with supervision 100' x 2.  PT Evaluation Precautions/Restrictions Precautions Precautions: Fall Precaution Comments: Trace quadricep strength bilaterally Required Braces or Orthoses: Knee Immobilizer - Right;Knee Immobilizer - Left Other Brace/Splint: bil KI for gait.  Restrictions Weight Bearing Restrictions: No Pain Pain Assessment Pain Assessment: No/denies pain Home Living/Prior Functioning Home Living Available Help at Discharge: Family Type of Home: House Home Access: Stairs to enter Technical brewer of Steps: 1+1 Home Layout: Two level Alternate Level Stairs-Number of Steps: flight Bathroom Shower/Tub: Multimedia programmer: Standard  Lives With: Spouse Prior Function Level of Independence: Independent with basic ADLs;Independent with homemaking with ambulation;Independent with gait;Independent with transfers  Able to Take Stairs?: Yes Driving: Yes Vocation: Full time employment 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[employer] Perception: Within [place]: Intact  Cognition Overall Cognitive Status: Within Functional Limits for tasks  assessed Arousal/Alertness: Awake/alert Memory: Appears intact Sensation Sensation Light Touch: Impaired Detail Light Touch Impaired Details: Impaired RLE;Impaired LLE Stereognosis: Appears Intact Hot/Cold: Appears Intact Proprioception: Appears Intact Coordination Gross Motor Movements are Fluid and Coordinated: Yes Fine Motor Movements are Fluid and Coordinated: Yes Motor  Motor Motor - Skilled Clinical Observations: LE quadriceps weakness   Trunk/Postural Assessment  Cervical Assessment Cervical Assessment: Within Functional Limits Thoracic Assessment Thoracic Assessment: Within Functional Limits Lumbar Assessment Lumbar Assessment: Within Functional Limits Postural Control Postural Control: Within Functional Limits  Balance Dynamic Standing Balance Dynamic Standing - Level of Assistance: 5: Stand by assistance Extremity Assessment  RUE Assessment RUE Assessment: Within Functional Limits LUE Assessment LUE Assessment: Within Functional Limits RLE Assessment RLE Assessment: (WFL except quads 2-/5) LLE Assessment LLE Assessment: (WFL except quads 1/5)   See Function Navigator for Current Functional Status.   Refer to Care Plan for Long Term Goals  Recommendations for other services: None   Discharge Criteria: Patient will be discharged from PT if patient refuses treatment 3 consecutive times without medical reason, if treatment goals not met, if there is a change in medical status, if patient makes no progress towards goals or if patient is discharged from hospital.  The above assessment, treatment plan, treatment alternatives and goals were discussed and mutually agreed upon: by patient  [facility] 12/05/2016, 12:59 PM

## 2016-12-05 NOTE — Progress Notes (Signed)
Patient information reviewed and entered into eRehab system by Alejandro Adcox, RN, CRRN, PPS Coordinator.  Information including medical coding and functional independence measure will be reviewed and updated through discharge.    

## 2016-12-05 NOTE — Progress Notes (Signed)
[place] PHYSICAL MEDICINE & REHABILITATION     PROGRESS NOTE  Subjective/Complaints:  Pt seen sitting up in bed this AM.  She slept well overnight.  She is ready for therapies.  She has questions regarding labetalol, NCS/EMG, and Neurology follow.    ROS: Denies CP, SOB, N/V/D.   Objective: Vital Signs: Blood pressure (!) 152/72, pulse 61, temperature 97.9 F (36.6 C), temperature source Oral, resp. rate 20, last menstrual period 02/16/2016, SpO2 100 %, unknown if currently breastfeeding. No results found. No results for input(s): WBC, HGB, HCT, PLT in the last 72 hours. No results for input(s): NA, K, CL, GLUCOSE, BUN, CREATININE, CALCIUM in the last 72 hours.  Invalid input(s): CO CBG (last 3)  No results for input(s): GLUCAP in the last 72 hours.  Wt Readings from Last 3 Encounters:  12/03/16 81.6 kg (180 lb)  11/01/16 77.1 kg (170 lb)  10/11/16 72.6 kg (160 lb)    Physical Exam:  BP (!) 152/72 (BP Location: Right Arm)   Pulse 61   Temp 97.9 F (36.6 C) (Oral)   Resp 20   LMP 02/16/2016 (Exact Date)   SpO2 100%  Constitutional: She appearswell-developedand well-nourished.  HENT: Normocephalicand atraumatic.  Eyes:EOMI. No discharge.  Cardiovascular:Normal rateand regular rhythm. No JVD.  Respiratory:Effort normaland breath sounds normal.   BD:ZHGDJ sounds are normal. She exhibitsno distension.  Minimal tenderness. Musculoskeletal: She exhibits noedemaor tenderness in extremitties.  Neurological: She isalertand oriented. Motor: 5/5 b/l UE B/l LE: HF 1+/5, KE 2+/5, ADF/PF 5/5, Abductors/Adductors 4/5 Sensation decreased to light touch medial thigh and knee Skin: Skin iswarmand dry.  Psychiatric: She has anormal mood and affect. Herbehavior is normal.Judgmentand thought contentnormal.   Assessment/Plan: 1. 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 Physiatrist is providing close team supervision and 24 hour management of active medical problems listed below. Physiatrist and rehab team continue to assess barriers to discharge/monitor patient progress toward functional and medical goals.  Function:  Bathing Bathing position      Bathing parts      Bathing assist        Upper Body Dressing/Undressing Upper body dressing                    Upper body assist        Lower Body Dressing/Undressing Lower body dressing                                  Lower body assist        Toileting Toileting          Toileting assist     Transfers Chair/bed transfer             Locomotion Ambulation           Wheelchair          Cognition Comprehension Comprehension assist level: Follows basic conversation/direction with no assist  Expression Expression assist level: Expresses basic needs/ideas: With no assist  Social Interaction    Problem Solving Problem solving assist level: Solves basic problems with no assist  Memory      Medical Problem List and Plan: 1.Functional and mobility deficits secondary tosecondary to 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 DVT Prophylaxis/Anticoagulation: Mechanical:Sequential compression devices, below kneeBilateral lower extremities 3. Pain  Management:oxycodone prn 4. Mood:LCSW to follow for evaluation and support. 5. Neuropsych: This patientiscapable of making decisions on herown behalf. 6. Skin/Wound Care:routine pressure relief measures 7. Fluids/Electrolytes/Nutrition:Monitor I/O.    Labs pending this AM 8. ABLA: Will continue to monitor. Added iron supplement.    Hb 10.0 on 11/21   Cont to monitor 9. AKI: Improving--encourage fluid intake.    Monitor with increased mobility 10. Thrombocytopenia: Resolved   Plts 217 on  11/21 11. HTN   Monitor BP with increased mobility  LOS (Days) 1 A FACE TO FACE EVALUATION WAS PERFORMED  Tallen Schnorr Lorie Phenix 12/05/2016 8:12 AM

## 2016-12-05 NOTE — Plan of Care (Signed)
  Progressing Consults RH SPINAL CORD INJURY PATIENT EDUCATION Description  See Patient Education module for education specifics.  12/05/2016 2356 - Progressing by Cornell Barman, RN RH PAIN MANAGEMENT RH STG PAIN MANAGED AT OR BELOW PT'S PAIN GOAL Description Pain at or below level 2.  12/05/2016 2356 - Progressing by Cornell Barman, RN RH KNOWLEDGE DEFICIT SCI RH STG INCREASE KNOWLEDGE OF SELF CARE AFTER SCI Description Pt will be able to direct self care and instruct family on care using cues/resources  12/05/2016 2356 - Progressing by Cornell Barman, RN SCI BOWEL ELIMINATION RH STG MANAGE BOWEL WITH ASSISTANCE Description STG Manage Bowel with Assistance. 12/05/2016 2356 - Progressing by Cornell Barman, RN SCI BLADDER ELIMINATION RH STG MANAGE BLADDER WITH ASSISTANCE Description STG Manage Bladder With Assistance 12/05/2016 2356 - Progressing by Cornell Barman, RN [facility] SKIN INTEGRITY RH STG SKIN FREE OF INFECTION/BREAKDOWN 12/05/2016 2356 - Progressing by Cornell Barman, RN RH STG MAINTAIN SKIN INTEGRITY WITH ASSISTANCE Description STG Maintain Skin Integrity With Assistance. 12/05/2016 2356 - Progressing by Cornell Barman, RN RH SAFETY RH STG ADHERE TO SAFETY PRECAUTIONS W/ASSISTANCE/DEVICE Description STG Adhere to Safety Precautions With Assistance/Device. 12/05/2016 2356 - Progressing by Cornell Barman, RN RH STG DECREASED RISK OF FALL WITH ASSISTANCE Description STG Decreased Risk of Fall With Assistance. 12/05/2016 2356 - Progressing by Cornell Barman, RN

## 2016-12-06 ENCOUNTER — Inpatient Hospital Stay (HOSPITAL_COMMUNITY): Payer: BLUE CROSS/BLUE SHIELD

## 2016-12-06 DIAGNOSIS — R609 Edema, unspecified: Secondary | ICD-10-CM

## 2016-12-06 DIAGNOSIS — E46 Unspecified protein-calorie malnutrition: Secondary | ICD-10-CM

## 2016-12-06 MED ORDER — PRO-STAT SUGAR FREE PO LIQD
30.0000 mL | Freq: Two times a day (BID) | ORAL | Status: DC
  Administered 2016-12-06 – 2016-12-08 (×5): 30 mL via ORAL
  Filled 2016-12-06 (×5): qty 30

## 2016-12-06 NOTE — Progress Notes (Signed)
[place] PHYSICAL MEDICINE & REHABILITATION     PROGRESS NOTE  Subjective/Complaints:  Patient seen lying in bed this morning. She states she slept well overnight. She states she had a good first in therapies yesterday. Confirmed with therapies, patient doing relatively well.  ROS: Denies CP, SOB, N/V/D.   Objective: Vital Signs: Blood pressure 120/65, pulse 60, temperature 98.2 F (36.8 C), temperature source Oral, resp. rate 16, last menstrual period 02/16/2016, SpO2 100 %, unknown if currently breastfeeding. No results found. Recent Labs    12/05/16 0705  WBC 6.5  HGB 10.0*  HCT 29.3*  PLT 217   Recent Labs    12/05/16 0705  NA 136  K 4.0  CL 107  GLUCOSE 83  BUN 17  CREATININE 0.81  CALCIUM 8.8*   CBG (last 3)  No results for input(s): GLUCAP in the last 72 hours.  Wt Readings from Last 3 Encounters:  12/03/16 81.6 kg (180 lb)  11/01/16 77.1 kg (170 lb)  10/11/16 72.6 kg (160 lb)    Physical Exam:  BP 120/65 (BP Location: Right Arm)   Pulse 60   Temp 98.2 F (36.8 C) (Oral)   Resp 16   LMP 02/16/2016 (Exact Date)   SpO2 100%  Constitutional: She appearswell-developedand well-nourished.  HENT: Normocephalicand atraumatic.  Eyes:EOMI. No discharge.  Cardiovascular:RRR. No JVD.  Respiratory:Effort normal and breath sounds normal.   KL:KJZPH sounds are normal. She exhibitsno distension.  Musculoskeletal: She exhibits noedemaor tenderness in extremitties.  Neurological: She isalertand oriented. Motor: 5/5 b/l UE (stable) B/l LE: HF 1+/5, KE 2+/5, ADF/PF 5/5, Abductors/Adductors 4/5 Sensation decreased to light touch medial thigh and knee Skin: Skin iswarmand dry.  Psychiatric: She has anormal mood and affect. Herbehavior is normal.Judgmentand thought contentnormal.   Assessment/Plan: 1. 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 Physiatrist is providing close team supervision and 24 hour management of active medical problems listed below. Physiatrist and rehab team continue to assess barriers to discharge/monitor patient progress toward functional and medical goals.  Function:  Bathing Bathing position   Position: Shower  Bathing parts Body parts bathed by patient: Right arm, Left arm, Chest, Abdomen, Front perineal area, Buttocks, Right upper leg, Left upper leg, Right lower leg, Left lower leg    Bathing assist Assist Level: Set up   Set up : To obtain items  Upper Body Dressing/Undressing Upper body dressing   What is the patient wearing?: Bra, Pull over shirt/dress Bra - Perfomed by patient: Thread/unthread right bra strap, Thread/unthread left bra strap, Hook/unhook bra (pull down sports bra)   Pull over shirt/dress - Perfomed by patient: Thread/unthread right sleeve, Thread/unthread left sleeve, Put head through opening, Pull shirt over trunk          Upper body assist Assist Level: More than reasonable time      Lower Body Dressing/Undressing Lower body dressing   What is the patient wearing?: Underwear, Pants, [employer]  - Performed by patient: Thread/unthread right underwear leg, Thread/unthread left underwear leg, Pull underwear up/down   Pants- Performed by patient: Thread/unthread right pants leg, Thread/unthread left pants leg, Pull pants up/down                     TED Hose - Performed by helper: Don/doff right TED hose, Don/doff left TED hose  Lower body assist Assist for lower body dressing: Set up  Toileting Toileting   Toileting steps completed by patient: Adjust clothing prior to toileting, Performs perineal hygiene, Adjust clothing after toileting      Toileting assist Assist level: Supervision or verbal cues   Transfers Chair/bed transfer   Chair/bed transfer method: Ambulatory Chair/bed transfer  assist level: Supervision or verbal cues Chair/bed transfer assistive device: Walker, Orthosis     Locomotion Ambulation     Max distance: 150 Assist level: Supervision or verbal cues   Wheelchair          Cognition Comprehension Comprehension assist level: Follows complex conversation/direction with no assist  Expression Expression assist level: Expresses complex ideas: With no assist  Social Interaction Social Interaction assist level: Interacts appropriately with others - No medications needed.  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 DVT Prophylaxis/Anticoagulation: Mechanical:Sequential compression devices, below kneeBilateral lower extremities 3. Pain Management:oxycodone prn 4. Mood:LCSW to follow for evaluation and support. 5. Neuropsych: This patientiscapable of making decisions on herown behalf. 6. Skin/Wound Care:routine pressure relief measures 7. Fluids/Electrolytes/Nutrition:Monitor I/O.    BMP within acceptable range on 11/22 8. ABLA: Will continue to monitor. Added iron supplement.    Hb 10.0 on 11/21   Cont to monitor 9. AKI: Resolved    Encourage fluid intake.  10. Thrombocytopenia: Resolved   Plts 217 on 11/21 11. HTN   Monitor BP with increased mobility 12. Hypoalbuminemia    Supplement initiated 11/22  LOS (Days) 2 A FACE TO FACE EVALUATION WAS PERFORMED  Arissa Fagin Lorie Phenix 12/06/2016 8:12 AM

## 2016-12-06 NOTE — Progress Notes (Signed)
Bilateral lower extremity venous duplex has been completed. Negative for DVT.  12/06/16 12:24 PM Carlos Levering RVT

## 2016-12-07 ENCOUNTER — Inpatient Hospital Stay (HOSPITAL_COMMUNITY): Payer: BLUE CROSS/BLUE SHIELD | Admitting: Physical Therapy

## 2016-12-07 ENCOUNTER — Inpatient Hospital Stay (HOSPITAL_COMMUNITY): Payer: BLUE CROSS/BLUE SHIELD | Admitting: Occupational Therapy

## 2016-12-07 ENCOUNTER — Inpatient Hospital Stay (HOSPITAL_COMMUNITY): Payer: BLUE CROSS/BLUE SHIELD

## 2016-12-07 DIAGNOSIS — I10 Essential (primary) hypertension: Secondary | ICD-10-CM

## 2016-12-07 MED ORDER — WITCH HAZEL-GLYCERIN EX PADS: 1.0000 "application " | MEDICATED_PAD | CUTANEOUS | 12 refills | Status: DC | PRN

## 2016-12-07 MED ORDER — COCONUT OIL OIL: 1.0000 "application " | TOPICAL_OIL | 0 refills | Status: DC | PRN

## 2016-12-07 MED ORDER — ACETAMINOPHEN 325 MG PO TABS: 325.0000 mg | ORAL_TABLET | ORAL | Status: DC | PRN

## 2016-12-07 MED ORDER — POLYSACCHARIDE IRON COMPLEX 150 MG PO CAPS
150.0000 mg | ORAL_CAPSULE | Freq: Every day | ORAL | 0 refills | Status: DC
Start: 2016-12-08 — End: 2017-01-03

## 2016-12-07 MED ORDER — SIMETHICONE 80 MG PO CHEW: 80.0000 mg | CHEWABLE_TABLET | ORAL | 0 refills | Status: DC | PRN

## 2016-12-07 MED ORDER — OXYCODONE HCL 5 MG PO TABS: 5.0000 mg | ORAL_TABLET | Freq: Four times a day (QID) | ORAL | 0 refills | Status: DC | PRN

## 2016-12-07 MED ORDER — DIBUCAINE 1 % RE OINT: 1.0000 "application " | TOPICAL_OINTMENT | RECTAL | 0 refills | Status: DC | PRN

## 2016-12-07 NOTE — Discharge Instructions (Signed)
Inpatient Rehab Discharge Instructions  [facility] Discharge date and time: 12/08/16   Activities/Precautions/ Functional Status: Activity: no lifting, driving, or strenuous exercise  till cleared by MD Diet: regular diet Wound Care: keep wound clean and dry   Functional status:  ___ No restrictions     ___ Walk up steps independently ___ 24/7 supervision/assistance   ___ Walk up steps with assistance _X__ Intermittent supervision/assistance  _X__ Bathe/dress independently ___ Walk with walker     ___ Bathe/dress with assistance ___ Walk Independently    ___ Shower independently _X__ Walk with assistance    ___ Shower with assistance _X__ No alcohol     ___ Return to work/school ________  Special Instructions: 1. [facility] to provide PT   Opioid Overdose Opioids are substances that relieve pain by binding to pain receptors in your brain and spinal cord. Opioids include illegal drugs, such as heroin, as well as prescription pain medicines.An opioid overdose happens when you take too much of an opioid substance. This can happen with any type of opioid, including:  Heroin.  Morphine.  Codeine.  Methadone.  Oxycodone.  Hydrocodone.  Fentanyl.  Hydromorphone.  Buprenorphine.  The effects of an overdose can be mild, dangerous, or even deadly. Opioid overdose is a medical emergency. What are the causes? This condition may be caused by:  Taking too much of an opioid by accident.  Taking too much of an opioid on purpose.  An error made by a health care provider who prescribes a medicine.  An error made by the pharmacist who fills the prescription order.  Using more than one substance that contains opioids at the same time.  Mixing an opioid with a substance that affects your heart, breathing, or blood pressure. These include alcohol, tranquilizers, sleeping pills, illegal drugs, and some over-the-counter medicines.  What increases the risk? This  condition is more likely in:  Children. They may be attracted to colorful pills. Because of a child's small size, even a small amount of a drug can be dangerous.  Elderly people. They may be taking many different drugs. Elderly people may have difficulty reading labels or remembering when they last took their medicine.  People who take an opioid on a long-term basis.  People who use: ? Illegal drugs. ? Other substances, including alcohol, while using an opioid.  People who have: ? A history of drug or alcohol abuse. ? Certain mental health conditions.  People who take opioids that are not prescribed for them.  What are the signs or symptoms? Symptoms of this condition depend on the type of opioid and the amount that was taken. Common symptoms include:  Sleepiness or difficulty waking from sleep.  Confusion.  Slurred speech.  Slowed breathing and a slow pulse.  Nausea and vomiting.  Abnormally small pupils.  Signs and symptoms that require emergency treatment include:  Cold, clammy, and pale skin.  Blue lips and fingernails.  Vomiting.  Gurgling sounds in the throat.  A pulse that is very slow or difficult to detect.  Breathing that is very slow, noisy, or difficult to detect.  Limp body.  Inability to respond to speech or be awakened from sleep (stupor).  How is this diagnosed? This condition is diagnosed based on your symptoms. It is important to tell your health care provider:  All of the opioidsthat you took.  When you took the opioids.  Whether you were drinking alcohol or using other substances.  Your health care provider will do a  physical exam. This exam may include:  Checking and monitoring your heart rate and rhythm, your breathing rate and depth, your temperature, and your blood pressure (vital signs).  Checking for abnormally small pupils.  Measuring oxygen levels in your blood.  You may also have blood tests or urine tests. How is this  treated? Supporting your vital signs and your breathing is the first step in treating an opioid overdose. Treatment may also include:  Giving fluids and minerals (electrolytes) through an IV tube.  Inserting a breathing tube (endotracheal tube) in your airway to help you breathe.  Giving oxygen.  Passing a tube through your nose and into your stomach (NG tube, or nasogastric tube) to wash out your stomach.  Giving medicines that: ? Increase your blood pressure. ? Absorb any opioid that is in your digestive system. ? Reverse the effects of the opioid (naloxone).  Ongoing counseling and mental health support if you intentionally overdosed or used an illegal drug.  Follow these instructions at home:  Take over-the-counter and prescription medicines only as told by your health care provider. Always ask your health care provider about possible side effects and interactions of any new medicine that you start taking.  Keep a list of all of the medicines that you take, including over-the-counter medicines. Bring this list with you to all of your medical visits.  Drink enough fluid to keep your urine clear or pale yellow.  Keep all follow-up visits as told by your health care provider. This is important. How is this prevented?  Get help if you are struggling with: ? Alcohol or drug use. ? Depression or another mental health problem.  Keep the phone number of your local poison control center near your phone or on your cell phone.  Store all medicines in safety containers that are out of the reach of children.  Read the drug inserts that come with your medicines.  Do not drink alcohol when taking opioids.  Do not use illegal drugs.  Do not take opioid medicines that are not prescribed for you. Contact a health care provider if:  Your symptoms return.  You develop new symptoms or side effects when you are taking medicines. Get help right away if:  You think that you or someone  else may have taken too much of an opioid. The hotline of the [facility] is 706-186-5333.  You or someone else is having symptoms of an opioid overdose.  You have serious thoughts about hurting yourself or others.  You have: ? Chest pain. ? Difficulty breathing. ? A loss of consciousness. Opioid overdose is an emergency. Do not wait to see if the symptoms will go away. Get medical help right away. Call your local emergency services (911 in the [place] Do not drive yourself to the hospital. This information is not intended to replace advice given to you by your health care provider. Make sure you discuss any questions you have with your health care provider. Document Released: 02/09/2004 Document Revised: 06/09/2015 Document Reviewed: 06/17/2014 Elsevier Interactive Patient Education  2018 [employer].   My questions have been answered and I understand these instructions. I will adhere to these goals and the provided educational materials after my discharge from the hospital.  Patient/Caregiver Signature _______________________________ Date __________  Clinician Signature _______________________________________ Date __________  Please bring this form and your medication list with you to all your follow-up doctor's appointments.

## 2016-12-07 NOTE — Patient Care Conference (Signed)
Inpatient RehabilitationTeam Conference and Plan of Care Update Date: 12/05/2016   Time: 10:50 AM    Patient Name: Brianna [name]      Medical Record Number: [id number]  Date of Birth: 07-26-1975 Sex: Female         Room/Bed: 4W02C/4W02C-01 Payor Info: Payor: [place] / Plan: BCBS OTHER / Product Type: *No Product type* /    Admitting Diagnosis: Femoral with nerve damage  Admit Date/Time:  12/04/2016  2:48 PM Admission Comments: No comment available   Primary Diagnosis:  <principal problem not specified> Principal Problem: <principal problem not specified>  Patient Active Problem List   Diagnosis Date Noted  . Reactive hypertension   . Hypoalbuminemia due to protein-calorie malnutrition ([place])   . AKI (acute kidney injury) ([place])   . Acute blood loss anemia   . Benign essential HTN   . 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 Status post vacuum-assisted vaginal delivery 11/28/2016  . Pregnancy 11/27/2016  . Stress fracture of metatarsal bone of left foot 10/11/2016  . Low back pain 05/04/2015  . Bilateral knee pain 05/04/2015  . Plantar fasciitis, bilateral 10/13/2014  . ABNORMALITY OF GAIT 11/02/2008    Expected Discharge Date: Expected Discharge Date: 12/11/16  Team Members Present: Physician leading conference: Dr. 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 Bathes at shower leve. Pt can dressed with lateral leans mod I  mod I overall at w/c level  progressing towards working away from [facility] with sit to stands and stand to sits, d/c planning, DME education and pt/ fam education   Mobility   supervision  mod I      Communication             Safety/Cognition/ Behavioral Observations            Pain   dermoplast spray for irritation and perineal discomfort; tylenol  <3   assess for pain q shift and prn   Skin   vaginal episiotomy  free of infection and breakdown  assess skin q shift and prn    Rehab Goals Patient on target to meet rehab goals: Yes *See Care Plan and progress notes for long and short-term goals.     Barriers to Discharge  Current Status/Progress Possible Resolutions Date Resolved   Physician    Medical stability     See above  Therapies, encourage fluids, optimize BP meds      Nursing                  PT  Inaccessible home environment  2 story home              OT                  SLP  SW                Discharge Planning/Teaching Needs:  Pt to d/c home with spouse and mother available to assist.  None   Team Discussion:  New eval;  Moving much better than anticipated.  Mod ind goals overall and anticipate short LOS  Revisions to Treatment Plan:  None    Continued Need for Acute Rehabilitation Level of Care: The patient requires daily medical management by a physician with specialized training in physical medicine and rehabilitation for the following conditions: Daily direction of a multidisciplinary physical rehabilitation program to ensure safe treatment while eliciting the highest outcome that is of practical value to the patient.: Yes Daily medical management of patient stability for increased activity during participation in an intensive rehabilitation regime.: Yes Daily analysis of laboratory values and/or radiology reports with any subsequent need for medication adjustment of  medical intervention for : Neurological problems;Renal problems;Blood pressure problems  Korynn Kenedy, [place] 12/07/2016, 2:37 PM

## 2016-12-07 NOTE — Progress Notes (Signed)
Physical Therapy Note  Patient Details  Name: Kathye Cipriani MRN: [id number] Date of Birth: 12/07/1975 Today's Date: 12/07/2016    Time: 830-930 60 minutes  1:1 No c/o pain.  Pt able to don/doff braces with mod I from bed level.  Gait 200' x 2 with RW supervision.  Stair negotiation training 1 step to simulate home entry. Pt able to perform with supervision and RW.  NMES for quad strengthening x 5 minutes each leg with good results.  SAQ with tapping 2 x 10 bilat. Pt pleased with improving quad strength and functional mobility.   Armida Vickroy 12/07/2016, 9:29 AM

## 2016-12-07 NOTE — Plan of Care (Signed)
  Progressing Consults RH SPINAL CORD INJURY PATIENT EDUCATION Description  See Patient Education module for education specifics.  12/07/2016 2318 - Progressing by Edd Arbour, RN RH PAIN MANAGEMENT RH STG PAIN MANAGED AT OR BELOW PT'S PAIN GOAL Description Pain at or below level 2.  12/07/2016 2318 - Progressing by Edd Arbour, RN RH KNOWLEDGE DEFICIT SCI RH STG INCREASE KNOWLEDGE OF SELF CARE AFTER SCI Description Pt will be able to direct self care and instruct family on care using cues/resources  12/07/2016 2318 - Progressing by Edd Arbour, RN SCI BOWEL ELIMINATION RH STG MANAGE BOWEL WITH ASSISTANCE Description STG Manage Bowel with min.Assistance.  12/07/2016 2318 - Progressing by Edd Arbour, RN SCI BLADDER ELIMINATION RH STG MANAGE BLADDER WITH ASSISTANCE Description STG Manage Bladder With min.Assistance  12/07/2016 2318 - Progressing by Edd Arbour, RN [facility] SKIN INTEGRITY RH STG SKIN FREE OF INFECTION/BREAKDOWN 12/07/2016 2318 - Progressing by Edd Arbour, RN RH STG MAINTAIN SKIN INTEGRITY WITH ASSISTANCE Description STG Maintain Skin Integrity With min.Assistance.  12/07/2016 2318 - Progressing by Edd Arbour, RN RH SAFETY RH STG ADHERE TO SAFETY PRECAUTIONS W/ASSISTANCE/DEVICE Description STG Adhere to Safety Precautions With min.Assistance/Device.  12/07/2016 2318 - Progressing by Edd Arbour, RN RH STG DECREASED RISK OF FALL WITH ASSISTANCE Description STG Decreased Risk of Fall With min.Assistance.  12/07/2016 2318 - Progressing by Edd Arbour, RN

## 2016-12-07 NOTE — Progress Notes (Signed)
Occupational Therapy Session Note  Patient Details  Name: Brianna [name] MRN: [id number] Date of Birth: 1975-07-26  Today's Date: 12/07/2016 OT Individual Time: 1100-1200 OT Individual Time Calculation (min): 60 min    Short Term Goals: Week 1:  OT Short Term Goal 1 (Week 1): STGs = LTGs  Skilled Therapeutic Interventions/Progress Updates:    1:1 Discussed d/c plans and functional d/c home, DME recommendations, and recommendations for setup at home. Discussed only going up and down stairs once a day for now. Setup BSC.  Practiced recommendations for stepping backwards into the shower and controlling a decent into the shower seat with supervision. Pt able to bathe mod I and dress sitting on tub seat with lateral leans mod I. Practiced toilet transfers with 3:1 over the commode with supervision with RW with KI donned- doffed them in standing to practice sit to stands without them with min A  To mod A with only one Ue support. Pt stood at sink without KI donned to brush teeth with min A to supervision with one UE support  Therapy Documentation Precautions:  Precautions Precautions: Fall Precaution Comments: Trace quadricep strength bilaterally Required Braces or Orthoses: Knee Immobilizer - Right, Knee Immobilizer - Left Other Brace/Splint: bil KI for gait.  Restrictions Weight Bearing Restrictions: Yes Pain:  no c/o pain  ADL: ADL ADL Comments: refer to functional navigator  See Function Navigator for Current Functional Status.   Therapy/Group: Individual Therapy  [facility] 12/07/2016, 2:42 PM

## 2016-12-07 NOTE — Progress Notes (Signed)
Occupational Therapy Session Note  Patient Details  Name: Brianna [name] MRN: [id number] Date of Birth: 07-06-1975  Today's Date: 12/07/2016 OT Individual Time: 1500-1600 OT Individual Time Calculation (min): 60 min    Short Term Goals: Week 1:  OT Short Term Goal 1 (Week 1): STGs = LTGs  Skilled Therapeutic Interventions/Progress Updates:    1:1. Pt with no c/o pain, just soreness in B quads from exercise. Pt ambulates to/from all tx destinations with RW with supervision. Pt requesting to practice alternate stair options. Pt completes 2x13 steps circumduction/hip elevation to walk up steps. OT demonstrating bumping up/down step for second round without BKIs donned for easier LE management. Pt sit to stand from steps with KIs with supervision. Pt completes lundry activity loading/unloading washer/dryer with supervision and no LOB. OT educates on using reacher to increase reach as needed. In sidelying, pt completes 3x15 knee extension with LE on powder board for gravity eliminated exercise with 1.5# ankle weight on RLE and .75# weight on LLE. Pt made MOD I in room with KIs and RW donned. Pt verbalizes understanding to use needed equipment for safe mobility. Exited session with pt seated in bed and all needs met.  Therapy Documentation Precautions:  Precautions Precautions: Fall Precaution Comments: Trace quadricep strength bilaterally Required Braces or Orthoses: Knee Immobilizer - Right, Knee Immobilizer - Left Other Brace/Splint: bil KI for gait.  Restrictions Weight Bearing Restrictions: Yes  See Function Navigator for Current Functional Status.   Therapy/Group: Individual Therapy  Tonny Branch 12/07/2016, 4:08 PM

## 2016-12-07 NOTE — Progress Notes (Signed)
Social Work  Social Work Assessment and Plan  Patient Details  Name: Brianna [name] MRN: [id number] Date of Birth: Jul 14, 1975  Today's Date: 12/07/2016  Problem List:  Patient Active Problem List   Diagnosis Date Noted  . Reactive hypertension   . Hypoalbuminemia due to protein-calorie malnutrition ([place])   . AKI (acute kidney injury) ([place])   . Acute blood loss anemia   . Benign essential HTN   . 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 Status post vacuum-assisted vaginal delivery 11/28/2016  . Pregnancy 11/27/2016  . Stress fracture of metatarsal bone of left foot 10/11/2016  . Low back pain 05/04/2015  . Bilateral knee pain 05/04/2015  . Plantar fasciitis, bilateral 10/13/2014  . ABNORMALITY OF GAIT 11/02/2008   Past Medical History:  Past Medical History:  Diagnosis Date  . Achilles tendinitis   . Headache   . Knee pain   . Newborn product of in vitro fertilization (IVF) pregnancy   . Plantar fasciitis    Past Surgical History:  Past Surgical History:  Procedure Laterality Date  . DILATION AND CURETTAGE OF UTERUS N/A 11/28/2016   Procedure: DILATATION AND CURETTAGE;  Surgeon: Paula Compton, MD;  Location: [place];  Service: Gynecology;  Laterality: N/A;  . WISDOM TOOTH EXTRACTION    . WRIST FRACTURE SURGERY     r/wrist   Social History:  reports that  has never smoked. she has never used smokeless tobacco. She reports that she drinks alcohol. She reports that she does not use drugs.  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  Social History Preferred language: English Religion:  Cultural Background: NA Education: college Read: Yes Write: Yes Employment Status: Employed Name of Employer: pt is a Chief Executive Officer with [employer] Return to Work Plans: Pt intends to return once she is medically cleared to do so. Legal Hisotry/Current Legal Issues: None Guardian/Conservator: None- per MD, pt is capable of making decisions on her own behalf.   Abuse/Neglect Abuse/Neglect Assessment Can Be Completed: Yes Physical Abuse: Denies, provider concerned (Comment) Verbal Abuse: Denies, provider concerned (Comment) Sexual Abuse: Denies, provider concered (Comment) Exploitation of patient/patient's resources: Denies, provider concerned (Comment) Self-Neglect: Denies  Emotional Status Pt's affect, behavior adn adjustment status: Pt pleasant and talks openly about her new daughter and how she is dealing with being separated so soon.  She does become a little tearful and states, "I guess that's (tears) to be expected."   Pyschiatric History: None Substance Abuse History: None  Patient / Family Perceptions, Expectations & Goals Pt/Family understanding of illness & functional limitations: Pt and family with good understanding of her nerve injuries suffered during labor and current functional limitations/ need for CIR. Premorbid pt/family roles/activities: pt fully independent Anticipated changes in roles/activities/participation: pt will need some caregiver support for herself as well as for her new daughter. Pt/family expectations/goals: "I can't wait to get home to her."  [employer]: None Premorbid Home Care/DME Agencies: None Transportation available at discharge: yes  Discharge Planning Living Arrangements: Spouse/significant other Support Systems: Spouse/significant other, Parent, Other relatives, Friends/neighbors Type of Residence: Private residence Insurance underwriter Resources: Multimedia programmer  (specify)(BCBS of [place]) Museum/gallery curator Resources:  Employment Museum/gallery curator Screen Referred: No Living Expenses: Higher education careers adviser Management: Spouse, Patient Does the patient have any problems obtaining your medications?: No Patient/Family Preliminary Plans: Pt to return home with her husband and infant daughter. Expected length of stay: ELOS 7 to 10 days  Clinical Impression Very pleasant woman here after suffering nerve injuries during labor.  She admits that it is very difficult for her to be away for her newborn daughter, however, husband has been bringing her and she has been pumping breast milk.  Anticipate a short LOS and mod independent goals.  Will follow for support and d/c planning needs.  Romell Cavanah 12/07/2016, 2:11 PM

## 2016-12-07 NOTE — Progress Notes (Signed)
Social Work  Discharge Note  The overall goal for the admission was met for:   Discharge location: Yes - home with spouse and mother to assist as needed.  Length of Stay: Yes - 4 days (with d/c on 12/08/16)  Discharge activity level: Yes - modified independent  Home/community participation: Yes  Services provided included: MD, RD, PT, OT, RN, Pharmacy and [place]: Private Insurance: [place]  Follow-up services arranged: Home Health: PT via [facility], DME: 18x18 lightweight w/c, cushion, rolling walker, drop arm commode and tub bench via [place] and Patient/Family has no preference for HH/DME agencies  Comments (or additional information):  Patient/Family verbalized understanding of follow-up arrangements: Yes  Individual responsible for coordination of the follow-up plan: pt  Confirmed correct DME delivered: Bryant Saye 12/07/2016    Lucero Auzenne

## 2016-12-07 NOTE — Care Management (Signed)
[facility] Individual Statement of Services  Patient Name:  Brianna [name]  Date:  12/07/2016  Welcome to the [facility].  Our goal is to provide you with an individualized program based on your diagnosis and situation, designed to meet your specific needs.  With this comprehensive rehabilitation program, you will be expected to participate in at least 3 hours of rehabilitation therapies Monday-Friday, with modified therapy programming on the weekends.  Your rehabilitation program will include the following services:  Physical Therapy (PT), Occupational Therapy (OT), 24 hour per day rehabilitation nursing, Therapeutic Recreaction (TR), Neuropsychology, Case Management (Social Worker), Rehabilitation Medicine, Nutrition Services and Pharmacy Services  Weekly team conferences will be held on Wednesdays to discuss your progress.  Your Social Worker will talk with you frequently to get your input and to update you on team discussions.  Team conferences with you and your family in attendance may also be held.  Expected length of stay: 5-7 days    Overall anticipated outcome: modified independent  Depending on your progress and recovery, your program may change. Your Social Worker will coordinate services and will keep you informed of any changes. Your Social Worker's name and contact numbers are listed  below.  The following services may also be recommended but are not provided by the [facility] will be made to provide these services after discharge if needed.  Arrangements include referral to agencies that provide these services.  Your insurance has been verified to be:  BCBS of [employer] primary doctor is:  Corrington  Pertinent information will be shared with your doctor and your insurance  company.  Social Worker:  [place], [place] or ([phone]   Information discussed with and copy given to patient by: Lennart Pall, 12/07/2016, 2:14 PM

## 2016-12-07 NOTE — Progress Notes (Signed)
Patient ID: Brianna [name], female   DOB: 10-Nov-1975, 41 y.o.   MRN: [id number] OB/Gyn progress Note PPD #8  Pt in good spirits, up in chair. Reports slow steady progress with rehab of quadriceps muscles.  More confident in walking and transferring, only with braces thus far though.   Bleeding light to minimal.  Good milk supply, baby latching and taking bottle well   BP good 120-130/60-70  Fundus 2 below umbilicus NT  Quadriceps flexing, but not able to extend knee from resting position  D/w pt continued outpatient therapy when team feels she is ready for d/c home.  She is going to ask about neurology f/u outpatient and see if I need to help arrange that.   BP stable on labetalol, expect we will be able to drop dose to 125m po BID in next week.  She is going to get cuff and monitor at home and call in pressures 3x weekly D/w her if gets unusual bleeding will repeat [place] possibly D&C will be needed but uterus involuting nicely.  If no unusual bleeding, will check [place] next 2-3 weeks in office.

## 2016-12-07 NOTE — Progress Notes (Signed)
Social Work Patient ID: Brianna [name], female   DOB: [birth year], 41 y.o.   MRN: [id number]  Met with pt following team conference on Wed and discussed target d/c of 11/27, however, met with her again today to discuss earlier d/c as she is making excellent gains daily.  Therapists and MD feel she is on track to meet goals and could discharge 11/24 - pt is agreeable.  We discussed f/u HHPT and DME needs - have made referrals.  Pt ready for d/c tomorrow following therapies.  Xyler Terpening, LCSW

## 2016-12-07 NOTE — Discharge Summary (Signed)
Physician Discharge Summary  Patient ID: Brianna [name] MRN: [id number] DOB/AGE: Oct 09, 1975 41 y.o.  Admit date: 12/04/2016 Discharge date: 12/08/2016  Discharge Diagnoses:  Principal Problem:   Injury of femoral nerve Active Problems:   Abnormality of gait   AKI (acute kidney injury) ([place])   Acute blood loss anemia   Hypoalbuminemia due to protein-calorie malnutrition ([facility])   Reactive hypertension   Discharged Condition: stable   Significant Diagnostic Studies: N/A   Labs:  Basic Metabolic Panel: BMP Latest Ref Rng & Units 12/05/2016 11/30/2016 11/29/2016  Glucose 65 - 99 mg/dL 83 102(H) 107(H)  BUN 6 - 20 mg/dL 17 19 23(H)  Creatinine 0.44 - 1.00 mg/dL 0.81 1.06(H) 1.36(H)  Sodium 135 - 145 mmol/L 136 136 131(L)  Potassium 3.5 - 5.1 mmol/L 4.0 4.3 4.5  Chloride 101 - 111 mmol/L 107 107 105  CO2 22 - 32 mmol/L 23 22 20(L)  Calcium 8.9 - 10.3 mg/dL 8.8(L) 7.2(L) 6.9(L)    CBC: CBC Latest Ref Rng & Units 12/05/2016 11/30/2016 11/29/2016  WBC 4.0 - 10.5 K/uL 6.5 9.7 -  Hemoglobin 12.0 - 15.0 g/dL 10.0(L) 8.5(L) -  Hematocrit 36.0 - 46.0 % 29.3(L) 24.6(L) -  Platelets 150 - 400 K/uL 217 123(L) 116(L)    CBG: No results for input(s): GLUCAP in the last 168 hours.  Brief HPI:   Brianna [name] a 41 y.o.femaleadmitted on 11/28/16 at 40 5/7 weeks for IOL with elevated BP and 2+ proteinuria, s/p delivery and s/p D&C whoon postpartum day 1started complaining of being unable to lift her legs and had difficulty weight bearing on BLE with sensory loss. Patient with complicated delivery with legs in stirrups for extended period of time and epidural catheter. Neurology consulted for work up due to concerns of epidural hematoma v/s neuropathy. CT lumbar spine negative with negative coag studies. Dr. 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 Therapy ongoing and patient showing improvement in quadricepts  movements but continues to have functional deficits. CIR recommended for follow up therapy   Hospital Course: Brianna [name] was admitted to rehab 12/04/2016 for inpatient therapies to consist of PT and OT at least three hours five days a week. Past admission physiatrist, therapy team and rehab RN have worked together to provide customized collaborative inpatient rehab. Blood pressures have been reasonably controlled on labetalol bid. Follow up labs revealed that  AKI has resolved and platelets have normalized. Irons supplement added for ABLA which is resolving. BLE dopplers were negative for DVT. She is continent of bowel and bladder. She sontinues to have minimal vaginal bleeding and is to contact OBGYN for any unusual bleeding. Protein supplement was added for low calorie malnutrition. Bilateral KI were ordered to help with BLE support and knee instability. She has made good progress and is modified independent at discharge. She will continue to receive follow up HHPT by [facility] after discharge.     Rehab course: During patient's stay in rehab brief team conference was held to discuss patient's progress, goals  and discuss barriers to discharge. At admission, patient required supervision with ADL tasks and mobility. She has had improvement in activity tolerance, balance, postural control, as well as ability to compensate for deficits.  She has had improvement in BLE strength and stability. She is able to complete ADLs at modified independent level. She is modified independent for transfers and is able to ambulating 200' with RW.      Disposition:  Home  Diet: Regular.   Special Instructions: 1. No Driving till off narcotics. 2. Contact MD in case of unusual vaginal bleeding or worsening of BLE strength.   Current Discharge Medication List    START taking these medications   Details  acetaminophen (TYLENOL) 325 MG tablet Take 1-2 tablets (325-650 mg total) by mouth every 4 (four)  hours as needed for mild pain.    coconut oil OIL Apply 1 application topically as needed. Refills: 0    dibucaine (NUPERCAINAL) 1 % OINT Place 1 application rectally as needed for hemorrhoids. Refills: 0    iron polysaccharides (NIFEREX) 150 MG capsule Take 1 capsule (150 mg total) by mouth daily. Qty: 30 capsule, Refills: 0    oxyCODONE (OXY IR/ROXICODONE) 5 MG immediate release tablet Take 1 tablet (5 mg total) by mouth every 6 (six) hours as needed for severe pain. Qty: 10 tablet, Refills: 0    simethicone (MYLICON) 80 MG chewable tablet Chew 1 tablet (80 mg total) by mouth every 4 (four) hours as needed for flatulence. Qty: 30 tablet, Refills: 0    witch hazel-glycerin (TUCKS) pad Apply 1 application topically as needed for hemorrhoids. Qty: 40 each, Refills: 12      CONTINUE these medications which have NOT CHANGED   Details  Calcium-Magnesium-Vitamin D 185-50-100 MG-MG-UNIT CAPS Take 1 tablet daily by mouth.    ibuprofen (ADVIL) 200 MG tablet Take 3 tablets (600 mg total) every 6 (six) hours as needed by mouth. Qty: 30 tablet, Refills: 0    labetalol (NORMODYNE) 200 MG tablet Take 1 tablet (200 mg total) 2 (two) times daily by mouth. Qty: 60 tablet, Refills: 1    Prenatal Vit-Fe Fumarate-FA (PRENATAL MULTIVITAMIN) TABS tablet Take 1 tablet daily at 12 noon by mouth.       Follow-up Information    Jamse Arn, MD Follow up.   Specialty:  Physical Medicine and Rehabilitation Why:  office will call you with follow up appointment Contact information: [address] [place] [address] [phone]        Corrington, Delsa Grana, MD Follow up today.   Specialty:  Family Medicine Why:  for post hospital follow up Contact information: [place] [address] [phone]        Paula Compton, MD Follow up.   Specialty:  Obstetrics and Gynecology Why:  for recheck appointment Contact information: [place] [address] [phone]           Signed: Bary Leriche 12/10/2016, 8:23 AM

## 2016-12-07 NOTE — Progress Notes (Signed)
PHYSICAL MEDICINE & REHABILITATION     PROGRESS NOTE  Subjective/Complaints:  Pt seen laying in bed this Am.  She slept well overnight. She is ready to resume therapies.   ROS: Denies CP, SOB, N/V/D.   Objective: Vital Signs: Blood pressure 128/71, pulse 60, temperature 98.2 F (36.8 C), temperature source Oral, resp. rate 16, last menstrual period 02/16/2016, SpO2 99 %, unknown if currently breastfeeding. No results found. Recent Labs    12/05/16 0705  WBC 6.5  HGB 10.0*  HCT 29.3*  PLT 217   Recent Labs    12/05/16 0705  NA 136  K 4.0  CL 107  GLUCOSE 83  BUN 17  CREATININE 0.81  CALCIUM 8.8*   CBG (last 3)  No results for input(s): GLUCAP in the last 72 hours.  Wt Readings from Last 3 Encounters:  12/03/16 81.6 kg (180 lb)  11/01/16 77.1 kg (170 lb)  10/11/16 72.6 kg (160 lb)    Physical Exam:  BP 128/71 (BP Location: Left Arm)   Pulse 60   Temp 98.2 F (36.8 C) (Oral)   Resp 16   LMP 02/16/2016 (Exact Date)   SpO2 99%  Constitutional: She appearswell-developedand well-nourished.  HENT: Normocephalicand atraumatic.  Eyes:EOMI. No discharge.  Cardiovascular:RRR. No JVD.  Respiratory:Effort normal and breath sounds normal.   AL:PFXTK sounds are normal. She exhibitsno distension.  Musculoskeletal: She exhibits noedemaor tenderness in extremitties.  Neurological: She isalertand oriented. Motor: 5/5 b/l UE (stable) B/l LE: HF 3-/5, KE 3-/5, ADF/PF 5/5, Abductors/Adductors 4/5 Sensation decreased to light touch medial thigh and knee (improving) Skin: Skin iswarmand dry.  Psychiatric: She has anormal mood and affect. Herbehavior is normal.Judgmentand thought contentnormal.   Assessment/Plan: 1. 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 Physiatrist is providing close  team supervision and 24 hour management of active medical problems listed below. Physiatrist and rehab team continue to assess barriers to discharge/monitor patient progress toward functional and medical goals.  Function:  Bathing Bathing position   Position: Shower  Bathing parts Body parts bathed by patient: Right arm, Left arm, Chest, Abdomen, Front perineal area, Buttocks, Right upper leg, Left upper leg, Right lower leg, Left lower leg    Bathing assist Assist Level: Set up   Set up : To obtain items  Upper Body Dressing/Undressing Upper body dressing   What is the patient wearing?: Bra, Pull over shirt/dress Bra - Perfomed by patient: Thread/unthread right bra strap, Thread/unthread left bra strap, Hook/unhook bra (pull down sports bra)   Pull over shirt/dress - Perfomed by patient: Thread/unthread right sleeve, Thread/unthread left sleeve, Put head through opening, Pull shirt over trunk          Upper body assist Assist Level: More than reasonable time      Lower Body Dressing/Undressing Lower body dressing   What is the patient wearing?: Underwear, Pants, [employer]  - Performed by patient: Thread/unthread right underwear leg, Thread/unthread left underwear leg, Pull underwear up/down   Pants- Performed by patient: Thread/unthread right pants leg, Thread/unthread left pants leg, Pull pants up/down                     TED Hose - Performed by helper: Don/doff right TED hose, Don/doff left TED hose  Lower body assist Assist for lower body dressing: Set up      Child psychotherapist  steps completed by patient: Adjust clothing prior to toileting, Performs perineal hygiene, Adjust clothing after toileting      Toileting assist Assist level: Supervision or verbal cues   Transfers Chair/bed transfer   Chair/bed transfer method: Ambulatory Chair/bed transfer assist level: Supervision or verbal cues Chair/bed transfer assistive device: Walker,  Orthosis     Locomotion Ambulation     Max distance: 150 Assist level: Supervision or verbal cues   Wheelchair          Cognition Comprehension Comprehension assist level: Follows complex conversation/direction with no assist  Expression Expression assist level: Expresses complex ideas: With no assist  Social Interaction Social Interaction assist level: Interacts appropriately with others - No medications needed.  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 DVT Prophylaxis/Anticoagulation: Mechanical:Sequential compression devices, below kneeBilateral lower extremities   Dopplers neg for DVT 3. Pain Management:oxycodone prn 4. Mood:LCSW to follow for evaluation and support. 5. Neuropsych: This patientiscapable of making decisions on herown behalf. 6. Skin/Wound Care:routine pressure relief measures 7. Fluids/Electrolytes/Nutrition:Monitor I/O.    BMP within acceptable range on 11/22 8. ABLA: Will continue to monitor. Added iron supplement.    Hb 10.0 on 11/21   Cont to monitor 9. AKI: Resolved    Encourage fluid intake.  10. Thrombocytopenia: Resolved   Plts 217 on 11/21 11. HTN   Cont Labetolol    Improving 12. Hypoalbuminemia    Supplement initiated 11/22  LOS (Days) 3 A FACE TO FACE EVALUATION WAS PERFORMED  Ankit Lorie Phenix 12/07/2016 7:50 AM

## 2016-12-08 ENCOUNTER — Inpatient Hospital Stay (HOSPITAL_COMMUNITY): Payer: BLUE CROSS/BLUE SHIELD | Admitting: Physical Therapy

## 2016-12-08 ENCOUNTER — Inpatient Hospital Stay (HOSPITAL_COMMUNITY): Payer: BLUE CROSS/BLUE SHIELD | Admitting: Occupational Therapy

## 2016-12-08 DIAGNOSIS — M84375A Stress fracture, left foot, initial encounter for fracture: Secondary | ICD-10-CM | POA: Diagnosis not present

## 2016-12-08 NOTE — Progress Notes (Signed)
Physical Therapy Discharge Summary  Patient Details  Name: Brianna [name] MRN: [id number] Date of Birth: Dec 22, 1975  Today's Date: 12/08/2016 PT Individual Time: 0900-1000 PT Individual Time Calculation (min): 60 min    Patient has met 9 of 9 long term goals due to improved activity tolerance, improved balance, increased strength, increased range of motion and ability to compensate for deficits.  Patient to discharge at an ambulatory level Supervision.   Patient's care partner is independent to provide the necessary physical assistance at discharge.  Reasons goals not met: all PT goals met.   Recommendation:  Patient will benefit from ongoing skilled PT services in home health setting to continue to advance safe functional mobility, address ongoing impairments in balance, strength, endurance, safety, and minimize fall risk.  Equipment: RW and WC  Reasons for discharge: treatment goals met and discharge from hospital  Patient/family agrees with progress made and goals achieved: Yes   PT Treatment.  PT instructed pt in Grad day assessment to measure progress toward goals. See below for details. Patient returned to room and left sitting EOB with call bell in reach and all needs met.      PT Discharge Precautions/Restrictions Precautions Precautions: Fall Required Braces or Orthoses: Knee Immobilizer - Right;Knee Immobilizer - Left Restrictions Weight Bearing Restrictions: No Pain: 0/10    Vision/Perception  Perception Perception: Within Functional Limits Praxis Praxis: Intact  Cognition Overall Cognitive Status: Within Functional Limits for tasks assessed Arousal/Alertness: Awake/alert Orientation Level: Oriented X4 Memory: Appears intact Awareness: Appears intact Problem Solving: Appears intact Safety/Judgment: Appears intact Sensation Sensation Light Touch: Impaired Detail Light Touch Impaired Details: Impaired RLE;Impaired LLE Stereognosis: Appears  Intact Hot/Cold: Appears Intact Proprioception: Appears Intact Additional Comments: Parathesia in bilateral inner thighs.  Coordination Gross Motor Movements are Fluid and Coordinated: Yes Fine Motor Movements are Fluid and Coordinated: Yes Motor  Motor Motor: Other (comment) Motor - Discharge Observations: improved overall weakness  Mobility Transfers Sit to Stand: 6: Modified independent (Device/Increase time);With upper extremity assist;Other/comment Stand to Sit: 6: Modified independent (Device/Increase time)  Car transfer: Modified independent with RW.  Locomotion  Ambulation Ambulation: Yes Ambulation/Gait Assistance: 6: Modified independent (Device/Increase time) Gait 268f with RW Gait: Yes Gait Pattern: Right circumduction;Left circumduction(bilat knee KI) Stairs / Additional Locomotion Stairs: Yes Stairs Assistance: 5: Supervision Stair Management Technique: One rail Right Wheelchair Mobility Wheelchair Mobility: Yes Wheelchair Assistance: 6: Modified independent (Device/Increase time) WEnvironmental health practitioner Both lower extermities Wheelchair Parts Management: Independent Distance: [id number]  Trunk/Postural Assessment  Cervical Assessment Cervical Assessment: Within Functional Limits Thoracic Assessment Thoracic Assessment: Within Functional Limits Lumbar Assessment Lumbar Assessment: Within Functional Limits Postural Control Postural Control: Within Functional Limits  Balance Dynamic Sitting Balance Dynamic Sitting - Level of Assistance: 7: Independent Dynamic Standing Balance Dynamic Standing - Level of Assistance: 6: Modified independent (Device/Increase time) Extremity Assessment  RUE Assessment RUE Assessment: Within Functional Limits LUE Assessment LUE Assessment: Within Functional Limits       See Function Navigator for Current Functional Status.  AuLorie Phenix1/24/2018, 2:36 PM

## 2016-12-08 NOTE — Progress Notes (Signed)
Occupational Therapy Session Note  Patient Details  Name: Brianna [name] MRN: [id number] Date of Birth: April 12, 1975   Occupational Therapy Discharge Summary  Patient has met 6 of 6 long term goals due to improved activity tolerance, improved balance, postural control, ability to compensate for deficits and functional use of  RIGHT lower and LEFT lower extremity.  Patient to discharge at overall Modified Independent level.  Patient's care partner is independent to provide the necessary physical assistance at discharge.    Reasons goals not met: n/a  Recommendation:  N/a on further OT at this tim  Equipment: w/c, RW, tub bench and 3:1  Reasons for discharge: treatment goals met and discharge from hospital  Patient/family agrees with progress made and goals achieved: Yes  OT Discharge Precautions/Restrictions  Precautions Precautions: Fall Required Braces or Orthoses: Knee Immobilizer - Right;Knee Immobilizer - Left Restrictions Weight Bearing Restrictions: No Pain  no c/o pain  ADL ADL ADL Comments: see functional navigator Vision Baseline Vision/History: Wears glasses Vision Assessment?: No apparent visual deficits Perception  Perception: Within Functional Limits Praxis Praxis: Intact Cognition Overall Cognitive Status: Within Functional Limits for tasks assessed Arousal/Alertness: Awake/alert Orientation Level: Oriented X4 Memory: Appears intact Awareness: Appears intact Problem Solving: Appears intact Safety/Judgment: Appears intact Sensation Sensation Light Touch: Impaired Detail Light Touch Impaired Details: Impaired RLE;Impaired LLE Stereognosis: Appears Intact Hot/Cold: Appears Intact Proprioception: Appears Intact Additional Comments: Parathesia in bilateral inner thighs.  Coordination Gross Motor Movements are Fluid and Coordinated: Yes Fine Motor Movements are Fluid and Coordinated: Yes Motor  Motor Motor: Other (comment) Motor - Discharge  Observations: improved overall weakness Mobility  Transfers Transfers: Sit to Stand;Stand to Sit Sit to Stand: 6: Modified independent (Device/Increase time);With upper extremity assist;Other/comment Stand to Sit: 6: Modified independent (Device/Increase time)  Trunk/Postural Assessment  Cervical Assessment Cervical Assessment: Within Functional Limits Thoracic Assessment Thoracic Assessment: Within Functional Limits Lumbar Assessment Lumbar Assessment: Within Functional Limits Postural Control Postural Control: Within Functional Limits  Balance Dynamic Sitting Balance Dynamic Sitting - Level of Assistance: 7: Independent Dynamic Standing Balance Dynamic Standing - Level of Assistance: 6: Modified independent (Device/Increase time) Extremity/Trunk Assessment RUE Assessment RUE Assessment: Within Functional Limits LUE Assessment LUE Assessment: Within Functional Limits   See Function Navigator for Current Functional Status.  Willeen [facility] 12/08/2016, 1:54 PM   Today's Date: 12/08/2016 OT Individual Time: 1100-1200 OT Individual Time Calculation (min): 60 min    Short Term Goals: Week 1:  OT Short Term Goal 1 (Week 1): STGs = LTGs Week 2:     Skilled Therapeutic Interventions/Progress Updates:    1:1 Pt completed bathing and dressing with KI donned mod I in the room at shower level. Pt able to ambulate down to the gym mod I with the [place] donned.  Doff them and laid down on the mat. Performed LE exercises to focus on quad contractions and control as well as hip flexors/ extensors, and stretching program.  Perform ambulation without KIs donned back and forth 100 feet 4 times before seated rest break. Perform modified OTAGO exercises in standing without KI donned with min guard for safety. Pt ambulated back to the room with KI donned and left with husband to d/c. HEP given with handouts. Pt is able to performed almost full leg raises on the right and 5-10 degree leg  raises on the left. Anticipate pt will d/c out of KI soon.   Therapy Documentation Precautions:  Precautions Precautions: Fall Precaution Comments: Trace quadricep strength bilaterally Required Braces or Orthoses: Knee  Immobilizer - Right, Knee Immobilizer - Left Other Brace/Splint: bil KI for gait.  Restrictions Weight Bearing Restrictions: No Pain:  no c/o pain  ADL: ADL ADL Comments: see functional navigator Vision Baseline Vision/History: Wears glasses Vision Assessment?: No apparent visual deficits Perception  Perception: Within Functional Limits Praxis Praxis: Intact Exercises:   Other Treatments:    See Function Navigator for Current Functional Status.   Therapy/Group: Individual Therapy  [facility] 12/08/2016, 1:40 PM

## 2016-12-08 NOTE — Progress Notes (Signed)
Patient discharged to home at with husband at 73 with all belongings and discharge instructions. All questions answered and DME delivered to room.

## 2016-12-08 NOTE — Progress Notes (Signed)
[place] PHYSICAL MEDICINE & REHABILITATION     PROGRESS NOTE  Subjective/Complaints:  Strength continues to improve in her legs although she remains weak in her quads.  He is prepared to go home  ROS: pt denies nausea, vomiting, diarrhea, cough, shortness of breath or chest pain   Objective: Vital Signs: Blood pressure 123/75, pulse 66, temperature 98 F (36.7 C), temperature source Oral, resp. rate 18, last menstrual period 02/16/2016, SpO2 99 %, unknown if currently breastfeeding. No results found. No results for input(s): WBC, HGB, HCT, PLT in the last 72 hours. No results for input(s): NA, K, CL, GLUCOSE, BUN, CREATININE, CALCIUM in the last 72 hours.  Invalid input(s): CO CBG (last 3)  No results for input(s): GLUCAP in the last 72 hours.  Wt Readings from Last 3 Encounters:  12/03/16 81.6 kg (180 lb)  11/01/16 77.1 kg (170 lb)  10/11/16 72.6 kg (160 lb)    Physical Exam:  BP 123/75 (BP Location: Right Arm)   Pulse 66   Temp 98 F (36.7 C) (Oral)   Resp 18   LMP 02/16/2016 (Exact Date)   SpO2 99%  Constitutional: She appearswell-developedand well-nourished.  HENT: Normocephalicand atraumatic.  Eyes:EOMI. No discharge.  Cardiovascular: Regular rate no JVD Respiratory:Effort normal and breath sounds normal.   GB:TDVVO sounds are normal. She exhibitsno distension.  Musculoskeletal: She exhibits noedemaor tenderness in extremitties.  Neurological: She isalertand oriented. Motor: 5/5 b/l UE (stable) B/l LE: HF 3-/5, KE 3-/5, ADF/PF 5/5, Abductors/Adductors 4/5 Sensation decreased to light touch medial thigh and knee (stable) Skin: Skin iswarmand dry.  Psychiatric: She has anormal mood and affect. Herbehavior is normal.Judgmentand thought contentnormal.   Assessment/Plan: 1. 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 Physiatrist is providing close team supervision and 24 hour management of active medical problems listed below. Physiatrist and rehab team continue to assess barriers to discharge/monitor patient progress toward functional and medical goals.  Function:  Bathing Bathing position   Position: Shower  Bathing parts Body parts bathed by patient: Right arm, Left arm, Chest, Abdomen, Front perineal area, Buttocks, Right upper leg, Left upper leg, Right lower leg, Left lower leg    Bathing assist Assist Level: No help, No cues   Set up : To obtain items  Upper Body Dressing/Undressing Upper body dressing   What is the patient wearing?: Bra, Pull over shirt/dress Bra - Perfomed by patient: Thread/unthread right bra strap, Thread/unthread left bra strap, Hook/unhook bra (pull down sports bra)   Pull over shirt/dress - Perfomed by patient: Thread/unthread right sleeve, Thread/unthread left sleeve, Put head through opening, Pull shirt over trunk          Upper body assist Assist Level: More than reasonable time      Lower Body Dressing/Undressing Lower body dressing   What is the patient wearing?: Underwear, Pants, [employer], Shoes Underwear - Performed by patient: Thread/unthread right underwear leg, Thread/unthread left underwear leg, Pull underwear up/down   Pants- Performed by patient: Thread/unthread right pants leg, Thread/unthread left pants leg, Pull pants up/down           Shoes - Performed by patient: Don/doff right shoe, Don/doff left shoe         TED Hose - Performed by helper: Don/doff right TED hose, Don/doff left TED hose  Lower body assist Assist for lower body dressing: Set up   Set up :  Don/doff TED stockings  Toileting Toileting   Toileting steps completed by patient: Adjust clothing prior to toileting, Performs perineal hygiene, Adjust clothing after toileting      Toileting assist Assist level: Supervision or verbal cues    Transfers Chair/bed transfer   Chair/bed transfer method: Ambulatory Chair/bed transfer assist level: Supervision or verbal cues Chair/bed transfer assistive device: Walker, Orthosis     Locomotion Ambulation     Max distance: 150 Assist level: Supervision or verbal cues   Wheelchair          Cognition Comprehension Comprehension assist level: Follows complex conversation/direction with no assist  Expression Expression assist level: Expresses complex ideas: With no assist  Social Interaction Social Interaction assist level: Interacts appropriately with others - No medications needed.  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 DVT Prophylaxis/Anticoagulation: Mechanical:Sequential compression devices, below kneeBilateral lower extremities   Dopplers neg for DVT 3. Pain Management:oxycodone prn 4. Mood:LCSW to follow for evaluation and support. 5. Neuropsych: This patientiscapable of making decisions on herown behalf. 6. Skin/Wound Care:routine pressure relief measures 7. Fluids/Electrolytes/Nutrition:Monitor I/O.    BMP within acceptable range on 11/22 8. ABLA: Will continue to monitor. Added iron supplement.    Hb 10.0 on 11/21   Cont to monitor 9. AKI: Resolved    Encourage fluid intake.  10. Thrombocytopenia: Resolved   Plts 217 on 11/21 11. HTN   Cont Labetolol    Improved 12. Hypoalbuminemia    Supplement initiated 11/22  LOS (Days) 4 A FACE TO FACE EVALUATION WAS PERFORMED  Margarette Vannatter T 12/08/2016 8:08 AM

## 2016-12-08 NOTE — Plan of Care (Signed)
All LTG and education completed from occupational therapy

## 2016-12-15 DIAGNOSIS — E8809 Other disorders of plasma-protein metabolism, not elsewhere classified: Secondary | ICD-10-CM | POA: Diagnosis not present

## 2016-12-15 DIAGNOSIS — D62 Acute posthemorrhagic anemia: Secondary | ICD-10-CM | POA: Diagnosis not present

## 2016-12-15 DIAGNOSIS — S7411XD Injury of femoral nerve at hip and thigh level, right leg, subsequent encounter: Secondary | ICD-10-CM | POA: Diagnosis not present

## 2016-12-15 DIAGNOSIS — N179 Acute kidney failure, unspecified: Secondary | ICD-10-CM | POA: Diagnosis not present

## 2016-12-15 DIAGNOSIS — S7412XD Injury of femoral nerve at hip and thigh level, left leg, subsequent encounter: Secondary | ICD-10-CM | POA: Diagnosis not present

## 2016-12-15 DIAGNOSIS — M6281 Muscle weakness (generalized): Secondary | ICD-10-CM | POA: Diagnosis not present

## 2016-12-17 ENCOUNTER — Telehealth: Payer: Self-pay

## 2016-12-17 NOTE — Telephone Encounter (Signed)
We may give verbal order for therapies, but she should have received orders at discharge.  She does not need any of those medications, especially if she can manage without them.  Thanks.

## 2016-12-17 NOTE — Telephone Encounter (Signed)
Herbert Deaner PT requesting verbal orders on patient for 2 times a week for 2 weeks. Also stated that patient is not taking these medications because she doesn't have them...lidocaine, tylenol 3, oxycodone, mylocan, and (toxerall ?)

## 2016-12-17 NOTE — Telephone Encounter (Signed)
Jim notified.

## 2016-12-19 DIAGNOSIS — S7412XD Injury of femoral nerve at hip and thigh level, left leg, subsequent encounter: Secondary | ICD-10-CM | POA: Diagnosis not present

## 2016-12-19 DIAGNOSIS — M6281 Muscle weakness (generalized): Secondary | ICD-10-CM | POA: Diagnosis not present

## 2016-12-19 DIAGNOSIS — E8809 Other disorders of plasma-protein metabolism, not elsewhere classified: Secondary | ICD-10-CM | POA: Diagnosis not present

## 2016-12-19 DIAGNOSIS — S7411XD Injury of femoral nerve at hip and thigh level, right leg, subsequent encounter: Secondary | ICD-10-CM | POA: Diagnosis not present

## 2016-12-19 DIAGNOSIS — N179 Acute kidney failure, unspecified: Secondary | ICD-10-CM | POA: Diagnosis not present

## 2016-12-19 DIAGNOSIS — D62 Acute posthemorrhagic anemia: Secondary | ICD-10-CM | POA: Diagnosis not present

## 2016-12-21 DIAGNOSIS — N179 Acute kidney failure, unspecified: Secondary | ICD-10-CM | POA: Diagnosis not present

## 2016-12-21 DIAGNOSIS — M6281 Muscle weakness (generalized): Secondary | ICD-10-CM | POA: Diagnosis not present

## 2016-12-21 DIAGNOSIS — E8809 Other disorders of plasma-protein metabolism, not elsewhere classified: Secondary | ICD-10-CM | POA: Diagnosis not present

## 2016-12-21 DIAGNOSIS — S7412XD Injury of femoral nerve at hip and thigh level, left leg, subsequent encounter: Secondary | ICD-10-CM | POA: Diagnosis not present

## 2016-12-21 DIAGNOSIS — D62 Acute posthemorrhagic anemia: Secondary | ICD-10-CM | POA: Diagnosis not present

## 2016-12-21 DIAGNOSIS — S7411XD Injury of femoral nerve at hip and thigh level, right leg, subsequent encounter: Secondary | ICD-10-CM | POA: Diagnosis not present

## 2016-12-25 ENCOUNTER — Ambulatory Visit (INDEPENDENT_AMBULATORY_CARE_PROVIDER_SITE_OTHER): Payer: BLUE CROSS/BLUE SHIELD | Admitting: Sports Medicine

## 2016-12-25 ENCOUNTER — Encounter: Payer: Self-pay | Admitting: Sports Medicine

## 2016-12-25 ENCOUNTER — Ambulatory Visit: Payer: BLUE CROSS/BLUE SHIELD | Admitting: Sports Medicine

## 2016-12-25 DIAGNOSIS — S7410XS Injury of femoral nerve at hip and thigh level, unspecified leg, sequela: Secondary | ICD-10-CM

## 2016-12-25 NOTE — Assessment & Plan Note (Signed)
Bilateral femoral nerve injury Likely positional during delivery She did have epidural and did not feel pressure  This hopefully will recover with good PT and time Given OP PT RX Given HEP  Reck in 6 weeks

## 2016-12-25 NOTE — Progress Notes (Signed)
Subjective:    Patient ID: Brianna [name], female    DOB: 1975/06/06, 41 y.o.   MRN: [id number]  HPI  Brianna [name] is a 41 yo F with a PMH of Chondromalacia who presents with lower extremity weakness. One month ago she gave birth to a baby. It was a NSVD and the second stage of labor lasted about 45 minutes. During labor she received an epidural injection. She believes that while she was pushing, her hips were externally rotated in the stirrups but during pushes legs  were taken out of the stirrups and pushed towards her head into extreme hip flexion to aid with delivery. Ever since then she has had difficulty with walking which has slowly improved with rehab and physical therapy  She had to spend 5 days in rehab hospital to be able to ambulate well enough to go home.  Marland Kitchen She was seen by a neurologist who thought she may have femoral nerve damage from the delivery. Currently she is able to walk carefully around the house but uses a walker when walking outside for safety with the snow and uneven surgaces. When going from sitting to standing or standing to sitting she haus to use her upper extremity strength to aid with that motion. Her knees and inside her thighs feel numb. She is looking for a second opinion and advice with therapy today.  Review of Systems  Constitutional: Negative.   HENT: Negative.   Eyes: Negative.   Respiratory: Negative.   Cardiovascular: Negative.   Gastrointestinal: Negative.   Endocrine: Negative.   Genitourinary: Negative.   Musculoskeletal: Positive for gait problem. Negative for joint swelling and myalgias.       Muscle weakness  Skin: Negative.   Allergic/Immunologic: Negative.   Neurological: Positive for numbness (bilateral knees).  Hematological: Negative.   Psychiatric/Behavioral: Negative.        Objective:   Physical Exam  Constitutional: She is oriented to person, place, and time. She appears well-developed and well-nourished. No distress.  HENT:    Head: Normocephalic and atraumatic.  Eyes: Conjunctivae and EOM are normal. Pupils are equal, round, and reactive to light. Right eye exhibits no discharge. Left eye exhibits no discharge.  Neck: Normal range of motion. Neck supple. No tracheal deviation present. No thyromegaly present.  Cardiovascular: Normal rate.  Pulmonary/Chest: Effort normal. No stridor.  Neurological: She is alert and oriented to person, place, and time.  Skin: Skin is warm and dry. She is not diaphoretic.  Psychiatric: She has a normal mood and affect. Her behavior is normal.  Vitals reviewed.  MSK: Mild atrophy noted of bilateral quad muscles. Normal knee appearance with no swelling. Decreased sensation to light touch over medial thigh and knees consistent with anterior cutaneous femoral nerve dermatome distribution bilaterally. No tenderness to palpation over hip. Tenderness to palpation over medial joint line of knee bilaterally. Active range of motion with knee extension limited to about 10 degrees, but I am able to passively raise it to neurtral (bilaterally). Negative McMurray's test b/l, Negative Favir/Fadir test b/l. Unable to perform [facility] due to weakness. Gait: careful small steps taken, stable and equal symmetrically.  Strength: Hip flexion: Left: 4-/5, right: 4/5 Hip abduction: Left: 4-/5, right: 5/5 Hip adduction: Left 3/5, right: 4/5 Knee extension: Left: 3/5, right: 4-/5 Knee flexion: Left: 4+/5, right: 4+/5 Ankle Plantar flexion: Left 5/5, right 5/5 Ankle Dorsal flexion: Left 5/5, right 5/5         Assessment & Plan:  #Bilateral Femoral Neuropathy #Bilateral  Quad weakness Acute. Improving. As evidenced by quad weakness on physical exam and sensation loss consistent with femoral nerve distribution. Likely related to ischemia of the femoral nerve due to positioning for child birth. Very rare phenomenon and likely unavoidable. Hopeful that she will make a full recovery, but it will likely take at  least 12 weeks. Since patient is already walking, this is a reassuring sign. Recommend Vit B6 supplementation and PT exercises -Vit B6 131m PO QD -Use of semi-recumbent bike for therapy -Avoid positions where legs are at 90 degrees to torso as to limit stress on femoral nerve such as sitting upright -Advised to increase home exercises to BID (or more!) and walking in between sets  -Counseled patient on avoiding cold whenever possible and using appropriate clothing -Amb ref to outpatient PT -Avoid driving for another 10 days, until seen by PMR for evaluation  #Chondromalacia Chronic. Advised patient to limit use of NSAIDs while breastfeeding. Recommended cryotherapy. -Ice knee q3-4h PRN for pain  Return to clinic PRN if symptoms fail to improve or worsen

## 2016-12-25 NOTE — Patient Instructions (Addendum)
1. Daily V6 supplementation (168m PO QD) 2. Ice knee every 3-4 hours as needed for pain 3. Semi recombinant bike, and avoid putting legs in 90 degree position 4. Keep legs warm  Appt with JBarbaraann Barthel(Physical Therapist) is on Wed 01/02/17 [facility] 1220pm [facility] [address] [place] [id number] Phone: [phone]

## 2017-01-03 ENCOUNTER — Encounter
Payer: BLUE CROSS/BLUE SHIELD | Attending: Physical Medicine & Rehabilitation | Admitting: Physical Medicine & Rehabilitation

## 2017-01-03 ENCOUNTER — Encounter: Payer: Self-pay | Admitting: Physical Medicine & Rehabilitation

## 2017-01-03 VITALS — BP 121/83 | HR 65

## 2017-01-03 DIAGNOSIS — X58XXXD Exposure to other specified factors, subsequent encounter: Secondary | ICD-10-CM | POA: Diagnosis not present

## 2017-01-03 DIAGNOSIS — D62 Acute posthemorrhagic anemia: Secondary | ICD-10-CM | POA: Insufficient documentation

## 2017-01-03 DIAGNOSIS — M25561 Pain in right knee: Secondary | ICD-10-CM | POA: Diagnosis not present

## 2017-01-03 DIAGNOSIS — Z1389 Encounter for screening for other disorder: Secondary | ICD-10-CM | POA: Diagnosis not present

## 2017-01-03 DIAGNOSIS — R2689 Other abnormalities of gait and mobility: Secondary | ICD-10-CM | POA: Insufficient documentation

## 2017-01-03 DIAGNOSIS — O1003 Pre-existing essential hypertension complicating the puerperium: Secondary | ICD-10-CM | POA: Insufficient documentation

## 2017-01-03 DIAGNOSIS — R269 Unspecified abnormalities of gait and mobility: Secondary | ICD-10-CM

## 2017-01-03 DIAGNOSIS — M25562 Pain in left knee: Secondary | ICD-10-CM | POA: Diagnosis not present

## 2017-01-03 DIAGNOSIS — Z3009 Encounter for other general counseling and advice on contraception: Secondary | ICD-10-CM | POA: Diagnosis not present

## 2017-01-03 DIAGNOSIS — R208 Other disturbances of skin sensation: Secondary | ICD-10-CM | POA: Insufficient documentation

## 2017-01-03 DIAGNOSIS — O9A23 Injury, poisoning and certain other consequences of external causes complicating the puerperium: Secondary | ICD-10-CM | POA: Diagnosis not present

## 2017-01-03 DIAGNOSIS — S344XXD Injury of lumbosacral plexus, subsequent encounter: Secondary | ICD-10-CM | POA: Insufficient documentation

## 2017-01-03 DIAGNOSIS — I10 Essential (primary) hypertension: Secondary | ICD-10-CM | POA: Diagnosis not present

## 2017-01-03 DIAGNOSIS — O9081 Anemia of the puerperium: Secondary | ICD-10-CM | POA: Diagnosis not present

## 2017-01-03 DIAGNOSIS — S7410XS Injury of femoral nerve at hip and thigh level, unspecified leg, sequela: Secondary | ICD-10-CM | POA: Diagnosis not present

## 2017-01-03 NOTE — Progress Notes (Signed)
Subjective:    Patient ID: Brianna [name], female    DOB: Jul 16, 1975, 41 y.o.   MRN: [id number]  HPI 42 y.o. female admitted  on 11/28/16 at 40 5/7 weeks for IOL with elevated BP and 2+ proteinuria,  s/p delivery and s/p D&C who on postpartum day 1 started complaining of being unable to lift her legs and had difficulty weight bearing on BLE with sensory loss presents for hospital follow up after receiving CIR for bilateral femoral nerve neuropathy.   Admit date: 12/04/2016 Discharge date: 12/08/2016  At discharge, she was instructed to follow up with PCP, but has not scheduled an appointment. She see Ob/Gyn today.  She saw Ortho. BP has been controlled.  Completed therapies, continue with HEP.  Denies falls.   Therapies: Completed HH therapies Mobility: No assistive device at present.  DME: Shower seat   Pain Inventory Average Pain 4 Pain Right Now 1 My pain is intermittent, sharp and aching  In the last 24 hours, has pain interfered with the following? General activity 1 Relation with others 0 Enjoyment of life 0 What TIME of day is your pain at its worst? . Sleep (in general) Good  Pain is worse with: bending and some activites Pain improves with: rest and heat/ice Relief from Meds: 2  Mobility walk without assistance ability to climb steps?  yes  Function employed # of hrs/week 40  Neuro/Psych weakness numbness tingling trouble walking  Prior Studies Any changes since last visit?  no  Physicians involved in your care Any changes since last visit?  no   Family History  Problem Relation Age of Onset  . Hyperlipidemia Mother   . Hyperlipidemia Father    Social History   Socioeconomic History  . Marital status: Married    Spouse name: None  . Number of children: None  . Years of education: None  . Highest education level: None  Social Needs  . Financial resource strain: None  . Food insecurity - worry: None  . Food insecurity - inability: None  .  Transportation needs - medical: None  . Transportation needs - non-medical: None  Occupational History  . None  Tobacco Use  . Smoking status: Never Smoker  . Smokeless tobacco: Never Used  Substance and Sexual Activity  . Alcohol use: Yes    Alcohol/week: 0.0 oz    Comment: 0  . Drug use: No  . Sexual activity: Not Currently  Other Topics Concern  . None  Social History Narrative  . None   Past Surgical History:  Procedure Laterality Date  . DILATION AND CURETTAGE OF UTERUS N/A 11/28/2016   Procedure: DILATATION AND CURETTAGE;  Surgeon: Paula Compton, MD;  Location: [place];  Service: Gynecology;  Laterality: N/A;  . WISDOM TOOTH EXTRACTION    . WRIST FRACTURE SURGERY     r/wrist   Past Medical History:  Diagnosis Date  . Achilles tendinitis   . Headache   . Knee pain   . Newborn product of in vitro fertilization (IVF) pregnancy   . Plantar fasciitis    BP 121/83   Pulse 65   LMP 02/16/2016 (Exact Date) Comment: 5 weeks post pregnanacy  SpO2 98%   Opioid Risk Score:   Fall Risk Score:  `1  Depression screen PHQ 2/9  Depression screen [facility] 2/9 05/04/2015 10/12/2014  Decreased Interest 0 0  Down, Depressed, Hopeless 0 0  PHQ - 2 Score 0 0     Review of Systems  Constitutional:  Negative.   HENT: Negative.   Eyes: Negative.   Respiratory: Negative.   Cardiovascular: Negative.   Gastrointestinal: Negative.   Endocrine: Negative.   Genitourinary: Negative.   Musculoskeletal: Positive for arthralgias.  Skin: Negative.   Allergic/Immunologic: Negative.   Neurological: Positive for numbness.       Dysesthesias  Hematological: Negative.   Psychiatric/Behavioral: Negative.   All other systems reviewed and are negative.     Objective:   Physical Exam Constitutional: She appears well-developed and well-nourished.  HENT: Normocephalic and atraumatic.  Eyes: EOMI. No discharge.  Cardiovascular: RRR. No JVD.  Respiratory: Effort normal and  breath sounds normal.   GI: Bowel sounds are normal. She exhibits no distension.  Musculoskeletal: She exhibits no edema or tenderness in extremitties.  Neurological: She is alert and oriented.  Motor: 5/5 b/l UE B/l LE: HF 4/5, KE 4-/5, ADF/PF 5/5 Sensation decreased to light touch medial thigh and knee (improving) Skin: Skin is warm and dry.  Psychiatric: She has a normal mood and affect. Her behavior is normal. Judgment and thought content normal.     Assessment & Plan:  41 y.o. female admitted  on 11/28/16 at 40 5/7 weeks for IOL with elevated BP and 2+ proteinuria,  s/p delivery and s/p D&C who on postpartum day 1 started complaining of being unable to lift her legs and had difficulty weight bearing on BLE with sensory loss presents for hospital follow up after receiving CIR for bilateral femoral nerve neuropathy.   1.  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  Pt would like to follow up PRN.    2. Pain Management  Cont PRN meds, mainly in knees at present  Encouraged discussion with OB regarding topical lidoderm  3. ABLA  Pt following up with OB  4. HTN  Relatively controlled  Cont meds per OB

## 2017-01-07 DIAGNOSIS — M84375A Stress fracture, left foot, initial encounter for fracture: Secondary | ICD-10-CM | POA: Diagnosis not present

## 2017-01-17 DIAGNOSIS — R2689 Other abnormalities of gait and mobility: Secondary | ICD-10-CM | POA: Diagnosis not present

## 2017-01-17 DIAGNOSIS — S7412XD Injury of femoral nerve at hip and thigh level, left leg, subsequent encounter: Secondary | ICD-10-CM | POA: Diagnosis not present

## 2017-01-17 DIAGNOSIS — S7411XD Injury of femoral nerve at hip and thigh level, right leg, subsequent encounter: Secondary | ICD-10-CM | POA: Diagnosis not present

## 2017-01-22 DIAGNOSIS — S7411XD Injury of femoral nerve at hip and thigh level, right leg, subsequent encounter: Secondary | ICD-10-CM | POA: Diagnosis not present

## 2017-01-22 DIAGNOSIS — S7412XD Injury of femoral nerve at hip and thigh level, left leg, subsequent encounter: Secondary | ICD-10-CM | POA: Diagnosis not present

## 2017-01-22 DIAGNOSIS — R2689 Other abnormalities of gait and mobility: Secondary | ICD-10-CM | POA: Diagnosis not present

## 2017-01-24 DIAGNOSIS — S7411XD Injury of femoral nerve at hip and thigh level, right leg, subsequent encounter: Secondary | ICD-10-CM | POA: Diagnosis not present

## 2017-01-24 DIAGNOSIS — R2689 Other abnormalities of gait and mobility: Secondary | ICD-10-CM | POA: Diagnosis not present

## 2017-01-24 DIAGNOSIS — S7412XD Injury of femoral nerve at hip and thigh level, left leg, subsequent encounter: Secondary | ICD-10-CM | POA: Diagnosis not present

## 2017-01-28 DIAGNOSIS — S7411XD Injury of femoral nerve at hip and thigh level, right leg, subsequent encounter: Secondary | ICD-10-CM | POA: Diagnosis not present

## 2017-01-28 DIAGNOSIS — S7412XD Injury of femoral nerve at hip and thigh level, left leg, subsequent encounter: Secondary | ICD-10-CM | POA: Diagnosis not present

## 2017-01-28 DIAGNOSIS — R2689 Other abnormalities of gait and mobility: Secondary | ICD-10-CM | POA: Diagnosis not present

## 2017-01-31 DIAGNOSIS — M6281 Muscle weakness (generalized): Secondary | ICD-10-CM | POA: Diagnosis not present

## 2017-01-31 DIAGNOSIS — S7411XD Injury of femoral nerve at hip and thigh level, right leg, subsequent encounter: Secondary | ICD-10-CM | POA: Diagnosis not present

## 2017-01-31 DIAGNOSIS — N179 Acute kidney failure, unspecified: Secondary | ICD-10-CM | POA: Diagnosis not present

## 2017-01-31 DIAGNOSIS — E8809 Other disorders of plasma-protein metabolism, not elsewhere classified: Secondary | ICD-10-CM | POA: Diagnosis not present

## 2017-01-31 DIAGNOSIS — S7412XD Injury of femoral nerve at hip and thigh level, left leg, subsequent encounter: Secondary | ICD-10-CM | POA: Diagnosis not present

## 2017-01-31 DIAGNOSIS — R2689 Other abnormalities of gait and mobility: Secondary | ICD-10-CM | POA: Diagnosis not present

## 2017-01-31 DIAGNOSIS — D62 Acute posthemorrhagic anemia: Secondary | ICD-10-CM | POA: Diagnosis not present

## 2017-02-04 DIAGNOSIS — S7412XD Injury of femoral nerve at hip and thigh level, left leg, subsequent encounter: Secondary | ICD-10-CM | POA: Diagnosis not present

## 2017-02-04 DIAGNOSIS — S7411XD Injury of femoral nerve at hip and thigh level, right leg, subsequent encounter: Secondary | ICD-10-CM | POA: Diagnosis not present

## 2017-02-04 DIAGNOSIS — R2689 Other abnormalities of gait and mobility: Secondary | ICD-10-CM | POA: Diagnosis not present

## 2017-02-06 DIAGNOSIS — R2689 Other abnormalities of gait and mobility: Secondary | ICD-10-CM | POA: Diagnosis not present

## 2017-02-06 DIAGNOSIS — S7412XD Injury of femoral nerve at hip and thigh level, left leg, subsequent encounter: Secondary | ICD-10-CM | POA: Diagnosis not present

## 2017-02-06 DIAGNOSIS — S7411XD Injury of femoral nerve at hip and thigh level, right leg, subsequent encounter: Secondary | ICD-10-CM | POA: Diagnosis not present

## 2017-02-07 DIAGNOSIS — M84375A Stress fracture, left foot, initial encounter for fracture: Secondary | ICD-10-CM | POA: Diagnosis not present

## 2017-02-11 DIAGNOSIS — R2689 Other abnormalities of gait and mobility: Secondary | ICD-10-CM | POA: Diagnosis not present

## 2017-02-11 DIAGNOSIS — S7411XD Injury of femoral nerve at hip and thigh level, right leg, subsequent encounter: Secondary | ICD-10-CM | POA: Diagnosis not present

## 2017-02-11 DIAGNOSIS — S7412XD Injury of femoral nerve at hip and thigh level, left leg, subsequent encounter: Secondary | ICD-10-CM | POA: Diagnosis not present

## 2017-02-14 DIAGNOSIS — S7411XD Injury of femoral nerve at hip and thigh level, right leg, subsequent encounter: Secondary | ICD-10-CM | POA: Diagnosis not present

## 2017-02-14 DIAGNOSIS — S7412XD Injury of femoral nerve at hip and thigh level, left leg, subsequent encounter: Secondary | ICD-10-CM | POA: Diagnosis not present

## 2017-02-14 DIAGNOSIS — R2689 Other abnormalities of gait and mobility: Secondary | ICD-10-CM | POA: Diagnosis not present

## 2017-02-18 DIAGNOSIS — R2689 Other abnormalities of gait and mobility: Secondary | ICD-10-CM | POA: Diagnosis not present

## 2017-02-18 DIAGNOSIS — S7412XD Injury of femoral nerve at hip and thigh level, left leg, subsequent encounter: Secondary | ICD-10-CM | POA: Diagnosis not present

## 2017-02-18 DIAGNOSIS — S7411XD Injury of femoral nerve at hip and thigh level, right leg, subsequent encounter: Secondary | ICD-10-CM | POA: Diagnosis not present

## 2017-02-21 DIAGNOSIS — S7411XD Injury of femoral nerve at hip and thigh level, right leg, subsequent encounter: Secondary | ICD-10-CM | POA: Diagnosis not present

## 2017-02-21 DIAGNOSIS — S7412XD Injury of femoral nerve at hip and thigh level, left leg, subsequent encounter: Secondary | ICD-10-CM | POA: Diagnosis not present

## 2017-02-21 DIAGNOSIS — R2689 Other abnormalities of gait and mobility: Secondary | ICD-10-CM | POA: Diagnosis not present

## 2017-02-25 DIAGNOSIS — S7411XD Injury of femoral nerve at hip and thigh level, right leg, subsequent encounter: Secondary | ICD-10-CM | POA: Diagnosis not present

## 2017-02-25 DIAGNOSIS — S7412XD Injury of femoral nerve at hip and thigh level, left leg, subsequent encounter: Secondary | ICD-10-CM | POA: Diagnosis not present

## 2017-02-25 DIAGNOSIS — R2689 Other abnormalities of gait and mobility: Secondary | ICD-10-CM | POA: Diagnosis not present

## 2017-03-05 DIAGNOSIS — R2689 Other abnormalities of gait and mobility: Secondary | ICD-10-CM | POA: Diagnosis not present

## 2017-03-05 DIAGNOSIS — S7412XD Injury of femoral nerve at hip and thigh level, left leg, subsequent encounter: Secondary | ICD-10-CM | POA: Diagnosis not present

## 2017-03-05 DIAGNOSIS — S7411XD Injury of femoral nerve at hip and thigh level, right leg, subsequent encounter: Secondary | ICD-10-CM | POA: Diagnosis not present

## 2017-03-19 DIAGNOSIS — S7412XD Injury of femoral nerve at hip and thigh level, left leg, subsequent encounter: Secondary | ICD-10-CM | POA: Diagnosis not present

## 2017-03-19 DIAGNOSIS — S7411XD Injury of femoral nerve at hip and thigh level, right leg, subsequent encounter: Secondary | ICD-10-CM | POA: Diagnosis not present

## 2017-03-19 DIAGNOSIS — R2689 Other abnormalities of gait and mobility: Secondary | ICD-10-CM | POA: Diagnosis not present

## 2017-03-26 DIAGNOSIS — S7412XD Injury of femoral nerve at hip and thigh level, left leg, subsequent encounter: Secondary | ICD-10-CM | POA: Diagnosis not present

## 2017-03-26 DIAGNOSIS — R2689 Other abnormalities of gait and mobility: Secondary | ICD-10-CM | POA: Diagnosis not present

## 2017-03-26 DIAGNOSIS — S7411XD Injury of femoral nerve at hip and thigh level, right leg, subsequent encounter: Secondary | ICD-10-CM | POA: Diagnosis not present

## 2017-04-02 DIAGNOSIS — S7412XD Injury of femoral nerve at hip and thigh level, left leg, subsequent encounter: Secondary | ICD-10-CM | POA: Diagnosis not present

## 2017-04-02 DIAGNOSIS — S7411XD Injury of femoral nerve at hip and thigh level, right leg, subsequent encounter: Secondary | ICD-10-CM | POA: Diagnosis not present

## 2017-04-02 DIAGNOSIS — R2689 Other abnormalities of gait and mobility: Secondary | ICD-10-CM | POA: Diagnosis not present

## 2017-04-17 DIAGNOSIS — S7411XD Injury of femoral nerve at hip and thigh level, right leg, subsequent encounter: Secondary | ICD-10-CM | POA: Diagnosis not present

## 2017-04-17 DIAGNOSIS — R2689 Other abnormalities of gait and mobility: Secondary | ICD-10-CM | POA: Diagnosis not present

## 2017-04-17 DIAGNOSIS — S7412XD Injury of femoral nerve at hip and thigh level, left leg, subsequent encounter: Secondary | ICD-10-CM | POA: Diagnosis not present

## 2017-05-17 DIAGNOSIS — S7412XD Injury of femoral nerve at hip and thigh level, left leg, subsequent encounter: Secondary | ICD-10-CM | POA: Diagnosis not present

## 2017-05-17 DIAGNOSIS — R2689 Other abnormalities of gait and mobility: Secondary | ICD-10-CM | POA: Diagnosis not present

## 2017-05-17 DIAGNOSIS — S7411XD Injury of femoral nerve at hip and thigh level, right leg, subsequent encounter: Secondary | ICD-10-CM | POA: Diagnosis not present

## 2017-10-24 DIAGNOSIS — D2262 Melanocytic nevi of left upper limb, including shoulder: Secondary | ICD-10-CM | POA: Diagnosis not present

## 2017-10-24 DIAGNOSIS — L723 Sebaceous cyst: Secondary | ICD-10-CM | POA: Diagnosis not present

## 2017-10-24 DIAGNOSIS — L918 Other hypertrophic disorders of the skin: Secondary | ICD-10-CM | POA: Diagnosis not present

## 2017-10-24 DIAGNOSIS — D225 Melanocytic nevi of trunk: Secondary | ICD-10-CM | POA: Diagnosis not present

## 2018-10-17 ENCOUNTER — Other Ambulatory Visit: Payer: Self-pay

## 2018-10-17 DIAGNOSIS — Z20822 Contact with and (suspected) exposure to covid-19: Secondary | ICD-10-CM

## 2018-10-18 LAB — NOVEL CORONAVIRUS, NAA: SARS-CoV-2, NAA: NOT DETECTED

## 2018-11-17 ENCOUNTER — Other Ambulatory Visit: Payer: Self-pay

## 2018-11-17 DIAGNOSIS — Z20822 Contact with and (suspected) exposure to covid-19: Secondary | ICD-10-CM

## 2018-11-18 LAB — NOVEL CORONAVIRUS, NAA: SARS-CoV-2, NAA: NOT DETECTED

## 2018-12-16 ENCOUNTER — Telehealth (INDEPENDENT_AMBULATORY_CARE_PROVIDER_SITE_OTHER): Payer: BC Managed Care – PPO | Admitting: Sports Medicine

## 2018-12-16 ENCOUNTER — Other Ambulatory Visit: Payer: Self-pay

## 2018-12-16 DIAGNOSIS — M25562 Pain in left knee: Secondary | ICD-10-CM | POA: Diagnosis not present

## 2018-12-16 DIAGNOSIS — M25561 Pain in right knee: Secondary | ICD-10-CM

## 2018-12-16 DIAGNOSIS — S7410XS Injury of femoral nerve at hip and thigh level, unspecified leg, sequela: Secondary | ICD-10-CM | POA: Diagnosis not present

## 2018-12-16 DIAGNOSIS — G8929 Other chronic pain: Secondary | ICD-10-CM

## 2018-12-16 DIAGNOSIS — M545 Low back pain, unspecified: Secondary | ICD-10-CM

## 2018-12-16 NOTE — Progress Notes (Signed)
Patient with Low Back Pain and follow up of femoral neuropathy  Patient agreed to video visit through My Chart and connection was made with good audio and visual.  Limits of visits explained.  Low Back Pain Last month bent over to pick up 44 year old from sliding board Had a sharp lower back pain Improved after 2 days but some pain persisted since that time Now hurts to extend back or to lift  ROS No sciatica sxs No new weakness No weakness in standing on toes  Video exam NAD Gets pain with 30 deg of extension Full flexion w no pain RT lateral bend pain ~ 30 deg but none on left lateral bend Able to walk heel, and toe Able to stand on 1 leg  2.  Bilatearl knee pain Feels strength has increased since last visit with some of exercises and time Has returned to some running up to 30 minutes 2 to 3 timese per week However, with RT running is getting some anterior knee pain  ROS No swelling No giving way or locking No redness  Virtual exam Full motion at knees Able to do a 1 leg mini-squat bilaterally to 30 deg with no pain No visible swelling on video  3. Femoral neuropathy Feels she has lost some of quad strength after getting away from gym routine Has not really done the HEP for hips as suggested Still with some numbness down anterior thighs

## 2018-12-16 NOTE — Assessment & Plan Note (Signed)
Given a  Series of Williams flexion exercises Knee to chest Knee to shoulder opp Pelvic tilt  Crunches  Caution with lifting and holding her toddler

## 2018-12-16 NOTE — Assessment & Plan Note (Signed)
This has gradually improved However, we need to make sure she stays on maintenance exercises for hip strength and quad strength On last exam still had significant deficits even though she had made a lot of recovery Still showing improvement and encourage a strict routine to keep strength stable  In person strength assessment 3 mos

## 2018-12-16 NOTE — Assessment & Plan Note (Signed)
I believe she has lost more quad strength with inability to get to gym This started with her femoral neuropathy after child birth  We need to reverse this or will get PF sxs with running Mini- squats Wall slides Short arc quad with ball between thighs Icing after runs  Reck in person in 3 months or so to assess both hip and quad strength

## 2018-12-18 DIAGNOSIS — L280 Lichen simplex chronicus: Secondary | ICD-10-CM | POA: Diagnosis not present

## 2018-12-18 DIAGNOSIS — D485 Neoplasm of uncertain behavior of skin: Secondary | ICD-10-CM | POA: Diagnosis not present

## 2018-12-18 DIAGNOSIS — D2261 Melanocytic nevi of right upper limb, including shoulder: Secondary | ICD-10-CM | POA: Diagnosis not present

## 2018-12-18 DIAGNOSIS — D225 Melanocytic nevi of trunk: Secondary | ICD-10-CM | POA: Diagnosis not present

## 2018-12-18 DIAGNOSIS — D2262 Melanocytic nevi of left upper limb, including shoulder: Secondary | ICD-10-CM | POA: Diagnosis not present

## 2019-01-26 ENCOUNTER — Ambulatory Visit: Payer: BC Managed Care – PPO | Attending: Internal Medicine

## 2019-01-26 DIAGNOSIS — Z20822 Contact with and (suspected) exposure to covid-19: Secondary | ICD-10-CM | POA: Diagnosis not present

## 2019-01-27 LAB — NOVEL CORONAVIRUS, NAA: SARS-CoV-2, NAA: NOT DETECTED

## 2019-03-02 ENCOUNTER — Ambulatory Visit: Payer: BC Managed Care – PPO | Attending: Internal Medicine

## 2019-03-02 DIAGNOSIS — Z20822 Contact with and (suspected) exposure to covid-19: Secondary | ICD-10-CM | POA: Diagnosis not present

## 2019-03-03 LAB — NOVEL CORONAVIRUS, NAA: SARS-CoV-2, NAA: NOT DETECTED

## 2019-03-08 IMAGING — CT CT L SPINE W/O CM
1 of 3 series · 9 of 14 positions shown, 12 images · non-contrast
Comparison: None.

CLINICAL DATA: Leg weakness after epidural.

EXAM:
CT LUMBAR SPINE WITHOUT CONTRAST
TECHNIQUE: Multidetector CT imaging of the lumbar spine was performed without
intravenous contrast administration. Multiplanar CT image
reconstructions were also generated.

[Series 5: (person_name) thins for pacs st · axial · 0.35mm/px · z∈[+897,+1078]mm · 9 of 567 slices shown, 12 images]
[im 57/567  soft-tissue]
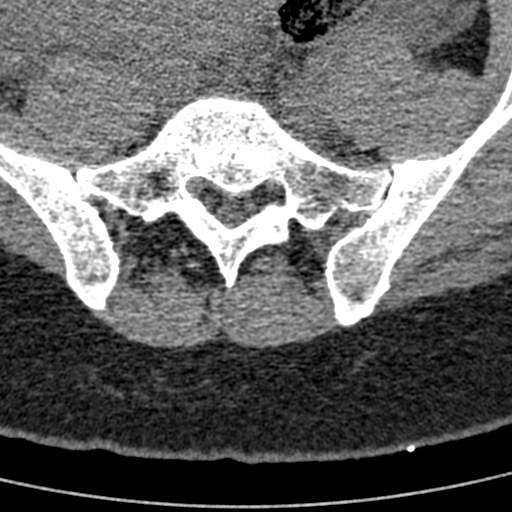
[im 57/567  bone]
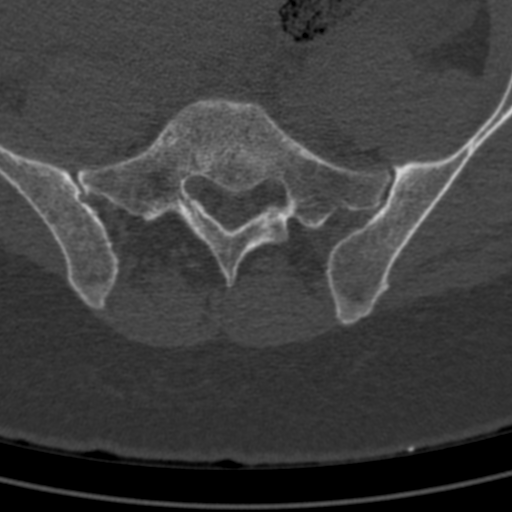
[im 114/567  bone]
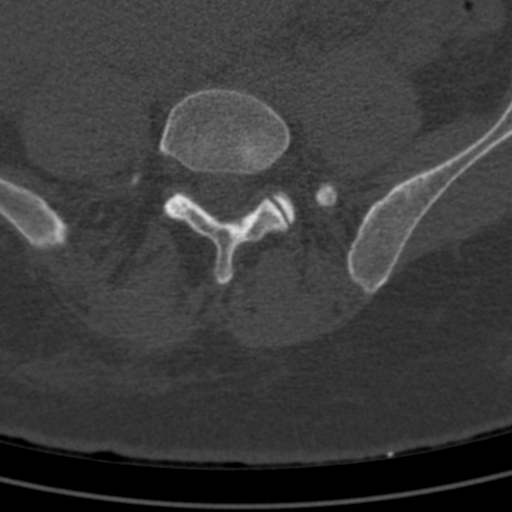
[im 170/567  bone]
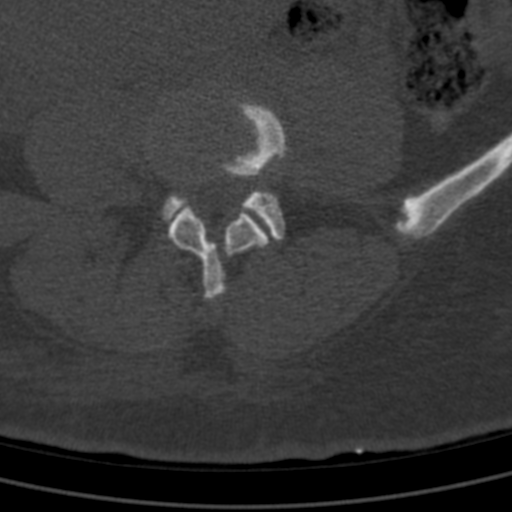
[im 227/567  bone]
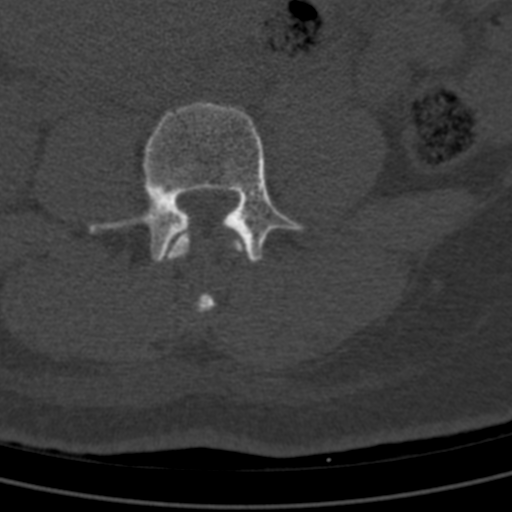
[im 284/567  soft-tissue]
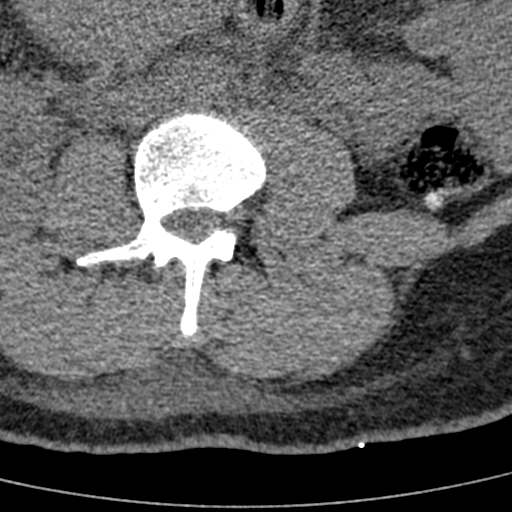
[im 284/567  bone]
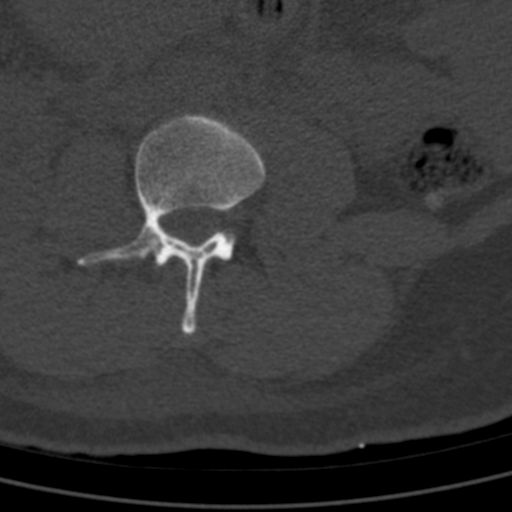
[im 340/567  bone]
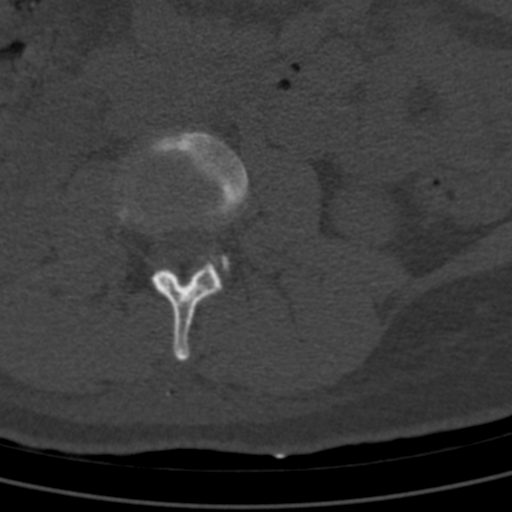
[im 397/567  bone]
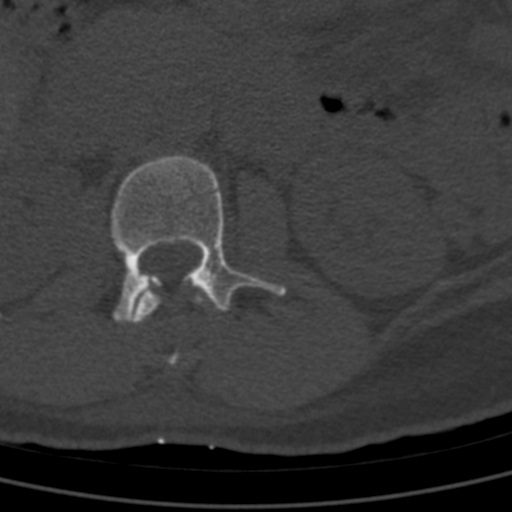
[im 453/567  bone]
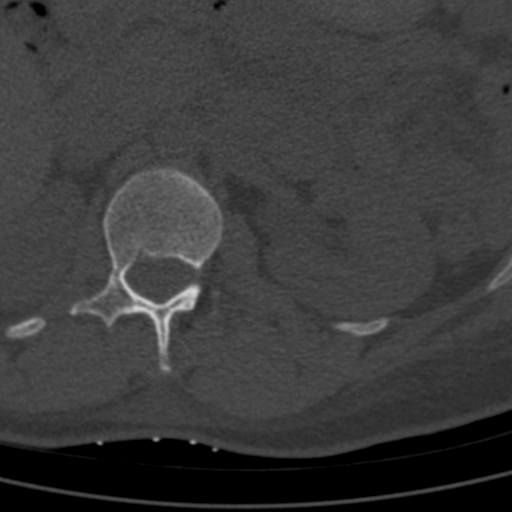
[im 510/567  soft-tissue]
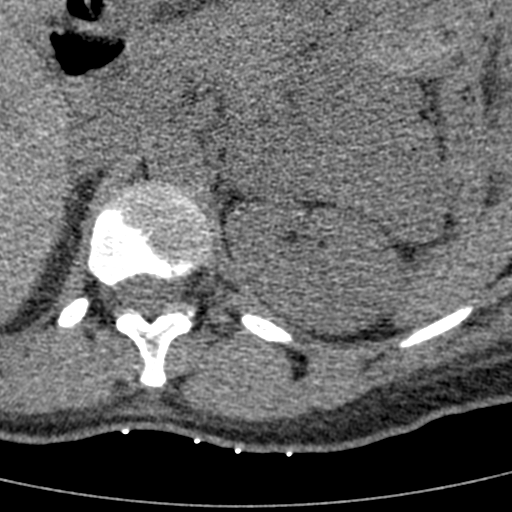
[im 510/567  bone]
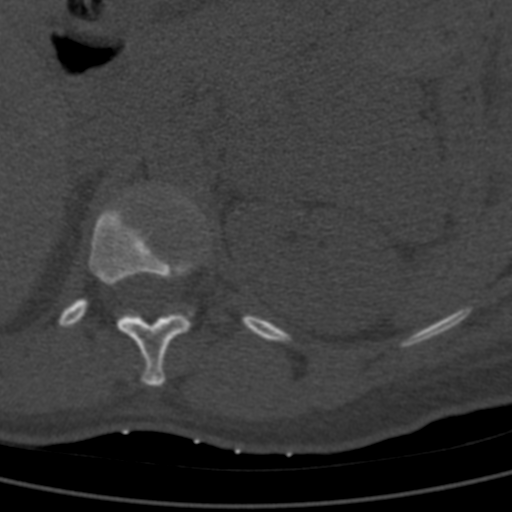

[9 of 14 positions shown; findings below may reference images not displayed]

FINDINGS: Segmentation: 5 lumbar type vertebrae.

Alignment: Normal.

Vertebrae: No acute fracture or focal pathologic process.

Paraspinal and other soft tissues: There is an epidural catheter
that terminates at the L3 level, with tip approximately 10 mm
posterior to the dorsal aspect of the thecal sac. CT is not
sensitive for the detection of epidural hematoma, but there is no
visible space-occupying collection or lesion in the lumbar spinal
canal.

Disc levels: No spinal canal or neural foraminal stenosis.
IMPRESSION: 1. Epidural catheter tip at the L3 level, 10 mm posterior to the
dorsal aspect of the thecal sac.
2. While CT is not sensitive for the detection epidural
abnormalities, no space-occupying collection is visible on this
study.

## 2019-03-26 DIAGNOSIS — Z23 Encounter for immunization: Secondary | ICD-10-CM | POA: Diagnosis not present

## 2019-04-25 DIAGNOSIS — Z23 Encounter for immunization: Secondary | ICD-10-CM | POA: Diagnosis not present

## 2019-11-25 DIAGNOSIS — H3540 Unspecified peripheral retinal degeneration: Secondary | ICD-10-CM | POA: Diagnosis not present

## 2019-12-08 DIAGNOSIS — R41840 Attention and concentration deficit: Secondary | ICD-10-CM | POA: Diagnosis not present

## 2019-12-08 DIAGNOSIS — Z0000 Encounter for general adult medical examination without abnormal findings: Secondary | ICD-10-CM | POA: Diagnosis not present

## 2019-12-08 DIAGNOSIS — H579 Unspecified disorder of eye and adnexa: Secondary | ICD-10-CM | POA: Diagnosis not present

## 2019-12-08 DIAGNOSIS — Z1211 Encounter for screening for malignant neoplasm of colon: Secondary | ICD-10-CM | POA: Diagnosis not present

## 2019-12-18 DIAGNOSIS — H579 Unspecified disorder of eye and adnexa: Secondary | ICD-10-CM | POA: Diagnosis not present

## 2019-12-28 DIAGNOSIS — Z1211 Encounter for screening for malignant neoplasm of colon: Secondary | ICD-10-CM | POA: Diagnosis not present

## 2019-12-31 DIAGNOSIS — Z01419 Encounter for gynecological examination (general) (routine) without abnormal findings: Secondary | ICD-10-CM | POA: Diagnosis not present

## 2019-12-31 DIAGNOSIS — Z124 Encounter for screening for malignant neoplasm of cervix: Secondary | ICD-10-CM | POA: Diagnosis not present

## 2019-12-31 DIAGNOSIS — Z1151 Encounter for screening for human papillomavirus (HPV): Secondary | ICD-10-CM | POA: Diagnosis not present

## 2019-12-31 DIAGNOSIS — Z6823 Body mass index (BMI) 23.0-23.9, adult: Secondary | ICD-10-CM | POA: Diagnosis not present

## 2019-12-31 DIAGNOSIS — Z1231 Encounter for screening mammogram for malignant neoplasm of breast: Secondary | ICD-10-CM | POA: Diagnosis not present

## 2019-12-31 DIAGNOSIS — Z130 Encounter for screening for diseases of the blood and blood-forming organs and certain disorders involving the immune mechanism: Secondary | ICD-10-CM | POA: Diagnosis not present

## 2020-01-02 ENCOUNTER — Other Ambulatory Visit: Payer: BC Managed Care – PPO

## 2020-01-02 DIAGNOSIS — Z20822 Contact with and (suspected) exposure to covid-19: Secondary | ICD-10-CM | POA: Diagnosis not present

## 2020-01-04 LAB — COLOGUARD: COLOGUARD: NEGATIVE

## 2020-01-05 LAB — NOVEL CORONAVIRUS, NAA: SARS-CoV-2, NAA: NOT DETECTED

## 2020-04-12 ENCOUNTER — Ambulatory Visit: Payer: 59 | Admitting: Sports Medicine

## 2020-04-12 ENCOUNTER — Other Ambulatory Visit: Payer: Self-pay

## 2020-04-12 DIAGNOSIS — M7742 Metatarsalgia, left foot: Secondary | ICD-10-CM

## 2020-04-12 DIAGNOSIS — G8929 Other chronic pain: Secondary | ICD-10-CM | POA: Diagnosis not present

## 2020-04-12 DIAGNOSIS — S7410XS Injury of femoral nerve at hip and thigh level, unspecified leg, sequela: Secondary | ICD-10-CM | POA: Diagnosis not present

## 2020-04-12 DIAGNOSIS — M7741 Metatarsalgia, right foot: Secondary | ICD-10-CM | POA: Diagnosis not present

## 2020-04-12 DIAGNOSIS — M545 Low back pain, unspecified: Secondary | ICD-10-CM

## 2020-04-12 NOTE — Progress Notes (Signed)
Chief complaint low back pain Secondary complaint some bilateral forefoot pain  Low back Patient realizes that she strained her low back lifting a heavy dog at 1 time Since then she has had some intermittent pain Recently however the low back has been more painful particularly with any back extension  Review of systems No sciatica No weakness in walking and still able to run  Foot pain Patient was placed in orthotics She has a history of metatarsal stress fracture and she has first metatarsal insufficiency These have worked well until recently she is feeling more forefoot pain This is on the bottom of the forefoot  Past medical history Significant for bilateral femoral nerve injuries Possibly related to obstetrical delivery She gets quadriceps and hip flexion weakness if she does not stay on a regular exercise program  Physical exam Pleasant female in no acute distress BP 120/76   Ht 5' 6"  (1.676 m)   Wt 135 lb (61.2 kg)   BMI 21.79 kg/m   Back extension to 30 degrees causes pain over the mid lumbar area Flexion is pain-free Rotation and lateral bending causes no significant symptoms Heel and toe walk are normal No weakness in testing L4-5 and L5-S1 Normal reflexes right ankle and patella/limited patellar reflex on the left but good ankle reflex  Orthotics are intact Feet show widening of the forefoot and loss of the transverse arch bilaterally Short first metatarsal segments

## 2020-04-12 NOTE — Assessment & Plan Note (Signed)
Over the years she has had plantar fasciitis and stress fractures She is now showing some metatarsalgia Placement of metatarsal pads actually relieve the pain Continue orthotics with metatarsal pads We can add these to other shoes if needed

## 2020-04-12 NOTE — Assessment & Plan Note (Signed)
While she has had some occasional flares in the past back has been more painful recently We will start her on knee to chest Knee to opposite shoulder Pelvic tilts Crutches Avoid any significant extension on her exercises Okay to continue lifting and running  Recheck in 6 weeks

## 2020-04-12 NOTE — Assessment & Plan Note (Signed)
Strength has substantially improved I want her to keep up with squats lunges and other exercises to keep the strong

## 2021-01-24 ENCOUNTER — Ambulatory Visit: Payer: Self-pay

## 2021-01-24 ENCOUNTER — Ambulatory Visit: Payer: 59 | Admitting: Family Medicine

## 2021-01-24 ENCOUNTER — Encounter: Payer: Self-pay | Admitting: Family Medicine

## 2021-01-24 VITALS — BP 116/72 | Ht 66.0 in | Wt 140.0 lb

## 2021-01-24 DIAGNOSIS — M25561 Pain in right knee: Secondary | ICD-10-CM | POA: Diagnosis not present

## 2021-01-24 NOTE — Progress Notes (Signed)
PCP: Corrington, Kip A, MD  Subjective:   HPI: Patient is a 46 y.o. female here for right knee pain.  Patient reports about 1 week ago her left foot slipped and she felt a 'tweak' in her anterior right knee. No swelling or bruising though anterior knee feels like it's bruised behind the kneecap. She has history of chondromalacia of her knees but no prior acute injuries. She was able to run 3 miles since this injury without any issues. Wants to make sure she didn't tear anything.  Past Medical History:  Diagnosis Date   Achilles tendinitis    Headache    Knee pain    Newborn product of in vitro fertilization (IVF) pregnancy    Plantar fasciitis     No current outpatient medications on file prior to visit.   No current facility-administered medications on file prior to visit.    Past Surgical History:  Procedure Laterality Date   DILATION AND CURETTAGE OF UTERUS N/A 11/28/2016   Procedure: DILATATION AND CURETTAGE;  Surgeon: Paula Compton, MD;  Location: [place];  Service: Gynecology;  Laterality: N/A;   WISDOM TOOTH EXTRACTION     WRIST FRACTURE SURGERY     r/wrist    Allergies  Allergen Reactions   Sulfa Antibiotics Nausea And Vomiting    BP 116/72    Ht 5' 6"  (1.676 m)    Wt 140 lb (63.5 kg)    BMI 22.60 kg/m   [place] Adult Exercise 01/24/2021  Frequency of aerobic exercise (# of days/week) 4  Average time in minutes 40  Frequency of strengthening activities (# of days/week) 3    No flowsheet data found.      Objective:  Physical Exam:  Gen: NAD, comfortable in exam room  Right knee: No gross deformity, ecchymoses, swelling. No TTP joint lines, post patellar facets, patellar and quad tendons, elsewhere about knee. FROM with normal strength. Negative ant/post drawers. Negative valgus/varus testing. Negative lachman.  Negative mcmurrays, apleys, thessalys. Negative patellar apprehension. NV intact distally.   Limited MSK  u/s right knee:  No effusion.  Patellar and quad tendons normal.  Assessment & Plan:  1. Right knee injury - consistent with slight shift of patella.  Her exam and ultrasound are reassuring today.  Icing if needed.  Quad strengthening.  Tylenol, ibuprofen only if needed.  F/u prn.

## 2021-01-24 NOTE — Patient Instructions (Signed)
You had a slight shift in your kneecap but structurally everything is ok. The exercises you do for this are the same as the ones for the chondromalacia you've had. Icing 15 minutes at a time as needed. Activities, running as tolerated. It's possible hills, stairs will bother this more but you're not doing any damage to the knee by doing these. Tylenol, ibuprofen only if needed. I'd expect this to resolve over the next 2-3 weeks. Follow up with [place] as needed.

## 2021-04-20 ENCOUNTER — Ambulatory Visit: Payer: 59 | Admitting: Sports Medicine

## 2021-04-20 VITALS — BP 140/41 | Ht 66.0 in | Wt 140.0 lb

## 2021-04-20 DIAGNOSIS — M79672 Pain in left foot: Secondary | ICD-10-CM

## 2021-04-20 DIAGNOSIS — M25552 Pain in left hip: Secondary | ICD-10-CM

## 2021-04-20 NOTE — Progress Notes (Signed)
? ?Brianna [name] is a 46 y.o. female who presents to [facility] today for the following: ? 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 ?Left foot pain ?She reports that she has orthotics that were made about 4-1/2 years ago while she was pregnant ?She states that recently she has been having some pain throughout the arch on the left side while she is running in her orthotics ?States that when she removes them it feels better ?States that she is concerned that she is going to develop Planter fasciitis ?Denies any dorsal foot pain ?The orthotics that she has have metatarsal pads bilaterally ?She denies any pain in the right foot ? ?PMH reviewed.  ?ROS as above. ?Medications reviewed. ? ?Exam:  ?BP (!) 140/41   Ht 5' 6"  (1.676 m)   Wt 140 lb (63.5 kg)   BMI 22.60 kg/m?  ?Gen: Well NAD ?MSK: ? ?Left Hip:  ?- Inspection: No gross deformity, no swelling, erythema, or ecchymosis b/l ?- Palpation: Tenderness palpation over the greater trochanter ?- ROM: Normal range of motion on Flexion, extension, abduction, internal and external rotation b/l ?- Strength: Normal strength in all fields b/l aside from 4 out of 5 strength in the left hip abductors ?- Neuro/vasc: NV intact distally b/l ?- Special Tests: Negative FABER and FADIR b/l.   ? ?Left Foot: ?Inspection: Bilateral collapse of transverse arch with mild navicular drop more so on left than right.  Has splaying of the first second and third digits bilaterally.  Morton's callus bilaterally.  No swelling, erythema, or bruising b/l.  ?Palpation: No tenderness to palpation b/l, specifically none at origin of  plantar fascia at calcaneus ?ROM: Full  ROM of the ankle b/l. Normal midfoot flexibility b/l ?Strength: 5/5 strength ankle in all planes b/l ?Neurovascular: N/V intact distally in the lower extremity b/l ?Special tests: normal midfoot flexibility. Normal calcaneal motion with heel raise  ? ?Running gait shows more mid to forefoot strike but no limp or excess pronation ? ?Limited US of left lateral hip  ?Greater trochanter visualized in both transverse and longitudinal axis.  Overlying the greater trochanter there is a small amount of hypoechoic collection of fluid within the tendon sheath of the hip rotators and the gluteus medius.  There are a few calcifications noted within the gluteus medius tendon overlying the greater trochanter as well.  No specific significant tearing.  Negative overlying Doppler flow. 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 ?Limited ultrasound of the left foot ?Plantar fascia visualized in transverse and longitudinal axis without any significant thickening or abnormality noted.  The plantar fascia measures 0.25 cm on the left. ?Impression: Normal plantar fascia ? ?Ultrasound and interpretation by Wolfgang Phoenix. Oneida Alar, MD and Arizona Constable, DO ? ? ?No results found. ? ? ?Assessment and Plan: ?1) Lateral pain of left hip ?No specific bursa noted on exam, however she does have some irritation within the tendon sheaths of the gluteus medius and hip rotators that coupled with her weakness likely are the cause of her pain.  We will give her  hip abductor and hip rotation exercises to perform at home.  Recommend starting back to running after about 1 week of exercises at half distance and slowly increase from there as tolerated.  She can continue to bike if she would like.  We will have her follow-up in 6 weeks to see how she is doing at that time.  If not making improvement, would consider nitro patches. ? ?Left foot pain ?Plantar fascia within normal limits on ultrasound  examination.  Metatarsal pad removed and patient was able to run in the hallway with improved comfort and continued neutral gait.  We will see how she does with this as well as strengthening of her hip abductors.  Follow-up in 6 weeks. ? ? ?Ari[place] Constable, D.O.  ?PGY-4 [facility]  ?04/20/2021 2:26 PM ? ?I observed and examined the patient with the [facility] resident and agree with assessment and plan.  Note reviewed and modified by me. ?Ila Mcgill, MD ?

## 2021-04-20 NOTE — Patient Instructions (Signed)
Thank you for coming to see me today. It was a pleasure. Today we talked about:  ? ?You will do exercises for your hip 4-5 times a week.  Severe pain while doing the exercises is not normal.  Do not do an exercise that causes pain - soreness is okay. ? ?After 1 week of exercises, you can start back to running at about 1/2 your mileage and slowly increase from there as tolerated. ? ?See how your orthotic feels without the pad. ? ?Please follow-up with [place] in 6 weeks. ? ?If you have any questions or concerns, please do not hesitate to call the office at [phone]. ? ?Best,  ? ?Arizona Constable, DO ?[place]  ?

## 2021-04-20 NOTE — Assessment & Plan Note (Addendum)
No specific bursa noted on exam, however she does have some irritation within the tendon sheaths of the gluteus medius and hip rotators that coupled with her weakness likely are the cause of her pain.  We will give her hip abductor and hip rotation exercises to perform at home.  Recommend starting back to running after about 1 week of exercises at half distance and slowly increase from there as tolerated.  She can continue to bike if she would like.  We will have her follow-up in 6 weeks to see how she is doing at that time.  If not making improvement, would consider nitro patches. ?

## 2021-04-20 NOTE — Assessment & Plan Note (Signed)
Plantar fascia within normal limits on ultrasound examination.  Metatarsal pad removed and patient was able to run in the hallway with improved comfort and continued neutral gait.  We will see how she does with this as well as strengthening of her hip abductors.  Follow-up in 6 weeks. ?

## 2021-06-01 ENCOUNTER — Ambulatory Visit: Payer: 59 | Admitting: Sports Medicine

## 2021-06-01 DIAGNOSIS — M545 Low back pain, unspecified: Secondary | ICD-10-CM

## 2021-06-01 DIAGNOSIS — G8929 Other chronic pain: Secondary | ICD-10-CM

## 2021-06-01 DIAGNOSIS — M25552 Pain in left hip: Secondary | ICD-10-CM | POA: Diagnosis not present

## 2021-06-01 NOTE — Assessment & Plan Note (Signed)
She has had some periodic issues with the low back pain Today this seems related more to the left SI joint probably be in somewhat to mobile Home exercise plan to include Williams flexion exercises SI joint stretches  Try this regimen over the next couple months but if not improved I would like to recheck her

## 2021-06-01 NOTE — Assessment & Plan Note (Signed)
This has improved by about 90% She may continue with a running program Continue the home exercises at least 3 times a week to keep this from recurring

## 2021-06-01 NOTE — Progress Notes (Signed)
Chief complaint left lateral hip pain  Patient seen on 04/20/2021 and given home exercise program for lateral hip strength This is improved dramatically and gives her much less hip pain She has returned to running She does get some soreness after running but it goes away before the next day  Lateral foot pain we remove the scaphoid pad that was supporting her arch and left her in a more neutral position on her orthotics and all the pain resolved  New but somewhat chronic complaint is some low back pain This hurts worse with standing No pain with running or walking More to the left side  Of interest is that 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  Physical exam Pleasant female in no acute distress BP 118/80   Ht 5' 6"  (1.676 m)   Wt 140 lb (63.5 kg)   BMI 22.60 kg/m   Pelvis examination reveals that the left SI joint is more mobile the right is somewhat tight however on pelvic motion, pretzel stretch,faber, she feels a little pain directly over the left SI joint  Hip abduction and abduction strength is evaluated bilaterally and has now returned to normal  Additional low back exam reveals some increased lumbar lordosis Some lack of full extension She can easily put her hands to the floor She has normal rotation and lateral bending but does feel this at her SI joint

## 2022-01-16 NOTE — Progress Notes (Signed)
[facility] [address] [place] [address] Phone: [phone] Subjective:    I'm seeing this patient by the request  of:  Corrington, Kip A, MD  CC: low back pain   [id number]  Brianna [name] is a 47 y.o. female coming in with complaint of LBP, L hip and L knee. Saw Dr. Darrick Penna in early 2023 for similar issues. Patient states LBP she tweaked it many many years ago and she will have episodes of soreness and gets aggravated if doing dead lifts, patient is a runner and it gets tight while running. This has been off and on for several years but has recently having more and more episodes. Left low back is worse than the right but while running the entire low back will get tight.   Left hip pain patient was diagnosed with bursitis in the Left hip and was given exercise and cleared up. Started running again and the hip started to hurt again, patient had dry needling and that helped. patient states that she feels the pain now more in the piriformis are rather than the bursitis area   L knee pain in medial lower patella that she can feel when she is running. Knee gets a little sore when she starts running then it will go away, when doing lower body strength (lunges or Squatting) she will feel the tweaking pain.   Patient will switch between naproxen and ibuprofen if there is soreness or pain. Patient can tell that the left side is definitely weaker than the right side.       Past Medical History:  Diagnosis Date   Achilles tendinitis    Headache    Knee pain    Newborn product of in vitro fertilization (IVF) pregnancy    Plantar fasciitis    Past Surgical History:  Procedure Laterality Date   DILATION AND CURETTAGE OF UTERUS N/A 11/28/2016   Procedure: DILATATION AND CURETTAGE;  Surgeon: Huel Cote, MD;  Location: [facility] BIRTHING SUIT[facility];  Service: Gynecology;  Laterality: N/A;   WISDOM TOOTH EXTRACTION     WRIST FRACTURE SURGERY     r/wrist    Social History   Socioeconomic History   Marital status: Married    Spouse name: Not on file   Number of children: Not on file   Years of education: Not on file   Highest education level: Not on file  Occupational History   Not on file  Tobacco Use   Smoking status: Never   Smokeless tobacco: Never  Substance and Sexual Activity   Alcohol use: Yes    Alcohol/week: 0.0 standard drinks of alcohol    Comment: 0   Drug use: No   Sexual activity: Not Currently  Other Topics Concern   Not on file  Social History Narrative   Not on file   Social Determinants of Health   Financial Resource Strain: Not on file  Food Insecurity: Not on file  Transportation Needs: Not on file  Physical Activity: Not on file  Stress: Not on file  Social Connections: Not on file   Allergies  Allergen Reactions   Sulfa Antibiotics Nausea And Vomiting   Family History  Problem Relation Age of Onset   Hyperlipidemia Mother    Hyperlipidemia Father    No current outpatient medications on file.   Reviewed prior external information including notes and imaging from  primary care provider As well as notes that were available from care everywhere and other healthcare systems.  Past medical history, social, surgical and family history all reviewed in electronic medical record.  No pertanent information unless stated regarding to the chief complaint.   Review of Systems:  No headache, visual changes, nausea, vomiting, diarrhea, constipation, dizziness, abdominal pain, skin rash, fevers, chills, night sweats, weight loss, swollen lymph nodes, body aches, joint swelling, chest pain, shortness of breath, mood changes. POSITIVE muscle aches  Objective  Blood pressure 122/68, pulse (!) 50, height 5\' 6"  (1.676 m), weight 147 lb (66.7 kg), SpO2 99 %, unknown if currently breastfeeding.   General: No apparent distress alert and oriented x3 mood and affect normal, dressed appropriately.  HEENT: Pupils  equal, extraocular movements intact  Respiratory: Patient's speak in full sentences and does not appear short of breath  Cardiovascular: No lower extremity edema, non tender, no erythema  Low back exam does have some mild loss of lordosis.  Patient does have some tightness noted with some mild pain on the lateral aspect of the left hip.  Minimal discomfort over the greater trochanteric area.  Tightness of the piriformis noted.  Patient on the left side does have 4+ strength compared to the contralateral side.  Left knee exam relatively unremarkable.  Maybe mild crepitus with mild lateral tracking noted.  Foot exam does show the patient does have some overpronation of the hindfoot.  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

## 2022-01-19 ENCOUNTER — Ambulatory Visit (INDEPENDENT_AMBULATORY_CARE_PROVIDER_SITE_OTHER): Payer: 59

## 2022-01-19 ENCOUNTER — Ambulatory Visit: Payer: 59 | Admitting: Family Medicine

## 2022-01-19 ENCOUNTER — Ambulatory Visit: Payer: Self-pay

## 2022-01-19 VITALS — BP 122/68 | HR 50 | Ht 66.0 in | Wt 147.0 lb

## 2022-01-19 DIAGNOSIS — M25562 Pain in left knee: Secondary | ICD-10-CM

## 2022-01-19 DIAGNOSIS — M9901 Segmental and somatic dysfunction of cervical region: Secondary | ICD-10-CM

## 2022-01-19 DIAGNOSIS — M5442 Lumbago with sciatica, left side: Secondary | ICD-10-CM

## 2022-01-19 DIAGNOSIS — M9902 Segmental and somatic dysfunction of thoracic region: Secondary | ICD-10-CM

## 2022-01-19 DIAGNOSIS — M5441 Lumbago with sciatica, right side: Secondary | ICD-10-CM | POA: Diagnosis not present

## 2022-01-19 DIAGNOSIS — M9908 Segmental and somatic dysfunction of rib cage: Secondary | ICD-10-CM

## 2022-01-19 DIAGNOSIS — M9904 Segmental and somatic dysfunction of sacral region: Secondary | ICD-10-CM

## 2022-01-19 DIAGNOSIS — M9903 Segmental and somatic dysfunction of lumbar region: Secondary | ICD-10-CM

## 2022-01-19 DIAGNOSIS — R269 Unspecified abnormalities of gait and mobility: Secondary | ICD-10-CM

## 2022-01-19 DIAGNOSIS — M25552 Pain in left hip: Secondary | ICD-10-CM

## 2022-01-19 NOTE — Patient Instructions (Addendum)
Good to see you  Exercises given CoQ 10 200mg  daily Vit C100mg   B complex  We will refer you to orthotics  Get some x rays on your way out See me again in 6 weeks

## 2022-01-21 ENCOUNTER — Encounter: Payer: Self-pay | Admitting: Family Medicine

## 2022-01-21 DIAGNOSIS — M9903 Segmental and somatic dysfunction of lumbar region: Secondary | ICD-10-CM | POA: Insufficient documentation

## 2022-01-21 NOTE — Assessment & Plan Note (Signed)

## 2022-01-21 NOTE — Assessment & Plan Note (Signed)
Patient has orthotics that are now going on close to 48 years old.  At this point I do think new ones could be beneficial.  They have worn inappropriately at the moment with the left one being significantly thicker giving patient a potential leg length discrepancy.  Patient will be referred accordingly this could help with the significant amount of the discomfort patient is having.

## 2022-01-21 NOTE — Assessment & Plan Note (Signed)
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  I believe that patient has had an injury to the femoral nerve and it seems to be left greater than right unfortunately is giving patient difficulty as well.  Likely patient does not have any significant atrophy which is helpful.  Discussed vitamin supplementations over-the-counter.  Discussed the possibility of different medications such as gabapentin that I think could be helpful as well.  Patient does work out usually considerably and does want to get back to running 15 to 20 miles a week.  I think that this is reasonable at some point but will start slow.  Follow-up with me again in 4 to 6 weeks.

## 2022-01-24 ENCOUNTER — Ambulatory Visit (INDEPENDENT_AMBULATORY_CARE_PROVIDER_SITE_OTHER): Payer: 59 | Admitting: Sports Medicine

## 2022-01-24 ENCOUNTER — Telehealth: Payer: Self-pay | Admitting: Family Medicine

## 2022-01-24 VITALS — BP 106/64 | Ht 66.0 in | Wt 145.0 lb

## 2022-01-24 DIAGNOSIS — R269 Unspecified abnormalities of gait and mobility: Secondary | ICD-10-CM | POA: Diagnosis not present

## 2022-01-24 NOTE — Progress Notes (Signed)
  Brianna [name] - 47 y.o. female MRN [id number]  Date of birth: [birth year]    CHIEF COMPLAINT:       SUBJECTIVE:   HPI: Pleasant 47 year old female comes to clinic to be fitted for custom orthotics.  She has had several pairs of these here over the last 20 years.  Dr. Hulan Saas recommended she come in to get new ones today.  Patient was fitted for a : standard, cushioned, semi-rigid orthotic. The orthotic was heated and afterward the patient stood on the orthotic blank positioned on the orthotic stand. The patient was positioned in subtalar neutral position and 10 degrees of ankle dorsiflexion in a weight bearing stance. After completion of molding, a stable base was applied to the orthotic blank. The blank was ground to a stable position for weight bearing. Size: 8 Base: blue EVA Posting: none Additional orthotic padding: none  Gait was neutral with orthotics in place. Patient found them to be comfortable. Follow-up as needed.    Dortha Kern, MD PGY-4, Sports Medicine [facility]  Addendum:  I was the preceptor for this visit and available for immediate consultation.  Karlton Lemon MD Kirt Boys

## 2022-01-24 NOTE — Telephone Encounter (Signed)
Patient called stating that she thought Dr Tamala Julian was going to send in Meloxicam?  [facility] [place] [facility]

## 2022-01-25 ENCOUNTER — Other Ambulatory Visit: Payer: Self-pay

## 2022-01-25 MED ORDER — MELOXICAM 7.5 MG PO TABS: 7.5000 mg | ORAL_TABLET | Freq: Every day | ORAL | 0 refills | Status: DC

## 2022-01-25 NOTE — Telephone Encounter (Signed)
Meloxicam 7.5mg  sent into pharmacy. Patient notified.

## 2022-03-08 NOTE — Progress Notes (Signed)
[facility] [place] Phone: [phone] Subjective:   Fontaine No, am serving as a scribe for Dr. Hulan Saas.  I'm seeing this patient by the request  of:  Corrington, Kip A, MD  CC: Back and neck pain follow-up  [id number]  Brianna [name] is a 47 y.o. female coming in with complaint of back and neck pain. OMT 01/19/2022. Also f/u for L knee and L hip pain. Patient states that she continues to have pain in lower back when standing in place. Hip pain has improved. Has pain with squats over medial aspect. Pain is not holding her back from workouts. Denies any radiating symptoms from her back.  Xray L hip 01/19/2022 IMPRESSION: Negative radiographs of the left hip.  Xray L knee 01/19/2022 IMPRESSION: Mild lateral patellar tilt and trace patellofemoral spurring.          Reviewed prior external information including notes and imaging from previsou exam, outside providers and external EMR if available.   As well as notes that were available from care everywhere and other healthcare systems.  Past medical history, social, surgical and family history all reviewed in electronic medical record.  No pertanent information unless stated regarding to the chief complaint.   Past Medical History:  Diagnosis Date   Achilles tendinitis    Headache    Knee pain    Newborn product of in vitro fertilization (IVF) pregnancy    Plantar fasciitis     Allergies  Allergen Reactions   Sulfa Antibiotics Nausea And Vomiting     Review of Systems:  No headache, visual changes, nausea, vomiting, diarrhea, constipation, dizziness, abdominal pain, skin rash, fevers, chills, night sweats, weight loss, swollen lymph nodes, body aches, joint swelling, chest pain, shortness of breath, mood changes. POSITIVE muscle aches  Objective  Blood pressure 118/82, pulse 72, height '5\' 6"'$  (1.676 m), weight 148 lb (67.1 kg), SpO2 99 %, unknown if  currently breastfeeding.   General: No apparent distress alert and oriented x3 mood and affect normal, dressed appropriately.  HEENT: Pupils equal, extraocular movements intact  Respiratory: Patient's speak in full sentences and does not appear short of breath  Cardiovascular: No lower extremity edema, non tender, no erythema  Low back exam does have some mild loss of lordosis.  Some tenderness to palpation in the paraspinal musculature.  Patient's left knee does have lateral tracking of the knee noted.  Mild crepitus. Neck exam does have Strelow tightness noted on the right side.  Some limited sidebending right greater than left.  Osteopathic findings  C3 flexed rotated and side bent right C6 flexed rotated and side bent left T3 extended rotated and side bent right inhaled rib T8 extended rotated and side bent left L2 flexed rotated and side bent right Sacrum right on right    Assessment and Plan:  Low back pain Patient did have the injury initially of the femoral nerve.  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  Tru pull lite brace given so patient can continue to stay active otherwise.  Increase activity slowly otherwise.  Discussed icing regimen and home exercises.  Follow-up again in 6 to 8 weeks    Nonallopathic problems  Decision today to treat with OMT was based on Physical Exam  After verbal consent patient was treated with HVLA, ME, FPR techniques in  cervical, rib, thoracic, lumbar, and sacral  areas  Patient tolerated the procedure well with improvement in symptoms  Patient given exercises, stretches and lifestyle modifications  See medications in patient instructions if given  Patient will follow up in 4-8 weeks    The above documentation has been reviewed and is accurate and complete Lyndal Pulley, DO          Note: This dictation  was prepared with Dragon dictation along with smaller phrase technology. Any transcriptional errors that result from this process are unintentional.

## 2022-03-09 ENCOUNTER — Ambulatory Visit (INDEPENDENT_AMBULATORY_CARE_PROVIDER_SITE_OTHER): Payer: 59 | Admitting: Family Medicine

## 2022-03-09 VITALS — BP 118/82 | HR 72 | Ht 66.0 in | Wt 148.0 lb

## 2022-03-09 DIAGNOSIS — M9902 Segmental and somatic dysfunction of thoracic region: Secondary | ICD-10-CM

## 2022-03-09 DIAGNOSIS — M9903 Segmental and somatic dysfunction of lumbar region: Secondary | ICD-10-CM

## 2022-03-09 DIAGNOSIS — M222X2 Patellofemoral disorders, left knee: Secondary | ICD-10-CM

## 2022-03-09 DIAGNOSIS — M545 Low back pain, unspecified: Secondary | ICD-10-CM

## 2022-03-09 DIAGNOSIS — M9904 Segmental and somatic dysfunction of sacral region: Secondary | ICD-10-CM

## 2022-03-09 DIAGNOSIS — M9908 Segmental and somatic dysfunction of rib cage: Secondary | ICD-10-CM | POA: Diagnosis not present

## 2022-03-09 DIAGNOSIS — M9901 Segmental and somatic dysfunction of cervical region: Secondary | ICD-10-CM

## 2022-03-09 DIAGNOSIS — G8929 Other chronic pain: Secondary | ICD-10-CM

## 2022-03-09 NOTE — Assessment & Plan Note (Signed)
Discussed patellofemoral syndrome, discussed icing regimen exercises, discussed which activities to do and which ones to avoid.  Tru pull lite brace given today.  Discussed which activities can be harmful for this.  Differential includes more of a lumbar radiculopathy causing weakness and may need to consider the possibility of advanced imaging if this continues.  Follow-up again in 6 to 8 weeks

## 2022-03-09 NOTE — Assessment & Plan Note (Signed)
Patient did have the injury initially of the femoral nerve.  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  Tru pull lite brace given so patient can continue to stay active otherwise.  Increase activity slowly otherwise.  Discussed icing regimen and home exercises.  Follow-up again in 6 to 8 weeks

## 2022-03-09 NOTE — Patient Instructions (Signed)
L tru pull lite See me in 6-8 weeks

## 2022-04-25 NOTE — Progress Notes (Signed)
Brianna [name] [address] [place] [address] Phone: [phone] Subjective:   Brianna [name], am serving as a scribe for Dr. Antoine Primas.  I'm seeing this patient by the request  of:  Corrington, Kip A, MD  CC: Low back pain follow-up  [id number]  Brianna [name] is a 47 y.o. female coming in with complaint of back and neck pain. OMT 03/09/2022. Also f/u for PF of L knee. Patient states that she is here for routine OMT, but her left kne still has its moments where it gets "tweaky" patient also states that he low back piriformis has been bothering her, states if she runs by herself at her own pace she is fine but if running with someone she really notices it. Also she did hot yoga the other day and later that night her back was really bothering her.    Patient would like a refill on Meloxicam  Medications patient has been prescribed: Meloxicam  Taking: Meloxicam         Reviewed prior external information including notes and imaging from previsou exam, outside providers and external EMR if available.   As well as notes that were available from care everywhere and other healthcare systems.  Past medical history, social, surgical and family history all reviewed in electronic medical record.  No pertanent information unless stated regarding to the chief complaint.   Past Medical History:  Diagnosis Date   Achilles tendinitis    Headache    Knee pain    Newborn product of in vitro fertilization (IVF) pregnancy    Plantar fasciitis     Allergies  Allergen Reactions   Sulfa Antibiotics Nausea And Vomiting     Review of Systems:  No headache, visual changes, nausea, vomiting, diarrhea, constipation, dizziness, abdominal pain, skin rash, fevers, chills, night sweats, weight loss, swollen lymph nodes, body aches, joint swelling, chest pain, shortness of breath, mood changes. POSITIVE muscle aches  Objective  Height 5\' 6"  (1.676 m),  unknown if currently breastfeeding.   General: No apparent distress alert and oriented x3 mood and affect normal, dressed appropriately.  HEENT: Pupils equal, extraocular movements intact  Respiratory: Patient's speak in full sentences and does not appear short of breath  Cardiovascular: No lower extremity edema, non tender, no erythema  Low back exam does have some loss lordosis noted.  Some tenderness to palpation in the paraspinal musculature.  Tightness noted more in the lumbosacral area.  Patient also has some tenderness in the thoracolumbar junction.  Osteopathic findings  C2 flexed rotated and side bent right C5 flexed rotated and side bent left T3 extended rotated and side bent right inhaled rib T9 extended rotated and side bent left L3 flexed rotated and side bent right Sacrum right on right       Assessment and Plan:  Low back pain Multifactorial still.  Patient has had tightness still.  Continuing to encourage patient to stay hydrated, discussed which activities to do and which ones to avoid, increase activity slowly over the course of next several weeks.  Follow-up again in 6 to 8 weeks discussed patient's meloxicam and would do prescription again if needed.  Discussed 5-day burst when needed.    Nonallopathic problems  Decision today to treat with OMT was based on Physical Exam  After verbal consent patient was treated with HVLA, ME, FPR techniques in cervical, rib, thoracic, lumbar, and sacral  areas  Patient tolerated the procedure well with improvement in symptoms  Patient given exercises, stretches and lifestyle modifications  See medications in patient instructions if given  Patient will follow up in 4-8 weeks     The above documentation has been reviewed and is accurate and complete [name], DO         Note: This dictation was prepared with Dragon dictation along with smaller phrase technology. Any transcriptional errors that result from  this process are unintentional.

## 2022-04-26 ENCOUNTER — Encounter: Payer: Self-pay | Admitting: Family Medicine

## 2022-04-26 ENCOUNTER — Ambulatory Visit (INDEPENDENT_AMBULATORY_CARE_PROVIDER_SITE_OTHER): Payer: 59 | Admitting: Family Medicine

## 2022-04-26 VITALS — Ht 66.0 in

## 2022-04-26 DIAGNOSIS — M9904 Segmental and somatic dysfunction of sacral region: Secondary | ICD-10-CM | POA: Diagnosis not present

## 2022-04-26 DIAGNOSIS — M9903 Segmental and somatic dysfunction of lumbar region: Secondary | ICD-10-CM

## 2022-04-26 DIAGNOSIS — M9908 Segmental and somatic dysfunction of rib cage: Secondary | ICD-10-CM | POA: Diagnosis not present

## 2022-04-26 DIAGNOSIS — M9901 Segmental and somatic dysfunction of cervical region: Secondary | ICD-10-CM | POA: Diagnosis not present

## 2022-04-26 DIAGNOSIS — G8929 Other chronic pain: Secondary | ICD-10-CM

## 2022-04-26 DIAGNOSIS — M9902 Segmental and somatic dysfunction of thoracic region: Secondary | ICD-10-CM

## 2022-04-26 DIAGNOSIS — M545 Low back pain, unspecified: Secondary | ICD-10-CM | POA: Diagnosis not present

## 2022-04-26 MED ORDER — MELOXICAM 7.5 MG PO TABS: 7.5000 mg | ORAL_TABLET | Freq: Every day | ORAL | 0 refills | Status: DC

## 2022-04-26 NOTE — Assessment & Plan Note (Signed)
Multifactorial still.  Patient has had tightness still.  Continuing to encourage patient to stay hydrated, discussed which activities to do and which ones to avoid, increase activity slowly over the course of next several weeks.  Follow-up again in 6 to 8 weeks discussed patient's meloxicam and would do prescription again if needed.  Discussed 5-day burst when needed.

## 2022-04-26 NOTE — Patient Instructions (Signed)
Good to see you  Meloxicam refilled  Follow up in

## 2022-06-21 NOTE — Progress Notes (Signed)
[name] [address] [place] [address] Phone: [phone] Subjective:   Brianna [name], am serving as [name] scribe for Dr. Antoine [name].  I'm seeing this patient by the request  of:  Corrington, Kip A, MD  CC: Back and neck pain follow-up  LKG:[id number]  Brianna [name] is [name] 47 y.o. female coming in with complaint of back and neck pain. OMT 4//11/2022. Patient states that L piriformis is aching constantly. Dry needling 2 weeks ago made her pain subsided. Has tried to run [name] couple of times but feels she is unable to run due to pain increasing. Bursitis pain has subsided.   Also notes pain in lower back when she is standing for longer than 20 minutes. Going to try using balance board at her desk.   Medications patient has been prescribed: Meloxicam  Taking: Yes         Reviewed prior external information including notes and imaging from previsou exam, outside providers and external EMR if available.   As well as notes that were available from care everywhere and other healthcare systems.  Past medical history, social, surgical and family history all reviewed in electronic medical record.  No pertanent information unless stated regarding to the chief complaint.   Past Medical History:  Diagnosis Date   Achilles tendinitis    Headache    Knee pain    Newborn product of in vitro fertilization (IVF) pregnancy    Plantar fasciitis     Allergies  Allergen Reactions   Sulfa Antibiotics Nausea And Vomiting     Review of Systems:  No headache, visual changes, nausea, vomiting, diarrhea, constipation, dizziness, abdominal pain, skin rash, fevers, chills, night sweats, weight loss, swollen lymph nodes, body aches, joint swelling, chest pain, shortness of breath, mood changes. POSITIVE muscle aches  Objective  Blood pressure 110/78, pulse 64, height 5\' 6"  (1.676 m), weight 146 lb (66.2 kg), SpO2 99 %, unknown if currently breastfeeding.    General: No apparent distress alert and oriented x3 mood and affect normal, dressed appropriately.  HEENT: Pupils equal, extraocular movements intact  Respiratory: Patient's speak in full sentences and does not appear short of breath  Cardiovascular: No lower extremity edema, non tender, no erythema  Gait MSK:  Back does have worsening low back pain.  Now has [name] positive Trendelenburg on the left side.  Positive Faber and positive straight leg test.         Assessment and Plan:  Low back pain Worsening low back pain with now [name] positive Trendelenburg on the left side and then showing weakness noted in the hip flexion as well as hip abduction.  Concerned that there is [name] nerve impingement that is contributing.  We discussed with patient that I do feel with patient already failing greater than 6 weeks of formal physical therapy, dry needling, anti-inflammatories, gabapentin without any significant improvement and now the increase in weakness that advanced imaging is warranted.  This could be potentially impingement noted of the lumbar spine but could also be potentially impingement at the piriformis or intersubstance tearing of the gluteal tendons that are contributing to this.  Discussed with patient about icing regimen and home exercises.  Discussed after imaging to follow-up with [place] to further evaluate.        The above documentation has been reviewed and is accurate and complete [name], DO          Note: This dictation was prepared with Reubin Milan  dictation along with smaller phrase technology. Any transcriptional errors that result from this process are unintentional.

## 2022-06-26 ENCOUNTER — Encounter: Payer: Self-pay | Admitting: Family Medicine

## 2022-06-26 ENCOUNTER — Ambulatory Visit (INDEPENDENT_AMBULATORY_CARE_PROVIDER_SITE_OTHER): Payer: 59 | Admitting: Family Medicine

## 2022-06-26 VITALS — BP 110/78 | HR 64 | Ht 66.0 in | Wt 146.0 lb

## 2022-06-26 DIAGNOSIS — G8929 Other chronic pain: Secondary | ICD-10-CM | POA: Diagnosis not present

## 2022-06-26 DIAGNOSIS — M25552 Pain in left hip: Secondary | ICD-10-CM | POA: Diagnosis not present

## 2022-06-26 DIAGNOSIS — M545 Low back pain, unspecified: Secondary | ICD-10-CM | POA: Diagnosis not present

## 2022-06-26 MED ORDER — MELOXICAM 7.5 MG PO TABS: 7.5000 mg | ORAL_TABLET | Freq: Every day | ORAL | 0 refills | Status: DC

## 2022-06-26 NOTE — Patient Instructions (Signed)
MRI lumbar  MRI L hip [phone] We will be in touch

## 2022-06-26 NOTE — Assessment & Plan Note (Signed)
Worsening low back pain with now a positive Trendelenburg on the left side and then showing weakness noted in the hip flexion as well as hip abduction.  Concerned that there is a nerve impingement that is contributing.  We discussed with patient that I do feel with patient already failing greater than 6 weeks of formal physical therapy, dry needling, anti-inflammatories, gabapentin without any significant improvement and now the increase in weakness that advanced imaging is warranted.  This could be potentially impingement noted of the lumbar spine but could also be potentially impingement at the piriformis or intersubstance tearing of the gluteal tendons that are contributing to this.  Discussed with patient about icing regimen and home exercises.  Discussed after imaging to follow-up with [place] to further evaluate.

## 2022-06-30 ENCOUNTER — Ambulatory Visit
Admission: RE | Admit: 2022-06-30 | Discharge: 2022-06-30 | Disposition: A | Discharge status: Home / Self Care | Payer: 59 | Source: Ambulatory Visit | Attending: Family Medicine | Admitting: Family Medicine

## 2022-06-30 DIAGNOSIS — M25552 Pain in left hip: Secondary | ICD-10-CM

## 2022-06-30 DIAGNOSIS — M545 Low back pain, unspecified: Secondary | ICD-10-CM

## 2022-07-29 ENCOUNTER — Other Ambulatory Visit: Payer: Self-pay | Admitting: Family Medicine

## 2023-08-08 ENCOUNTER — Other Ambulatory Visit: Payer: Self-pay

## 2023-08-08 ENCOUNTER — Other Ambulatory Visit: Payer: Self-pay | Admitting: Family Medicine

## 2023-08-08 DIAGNOSIS — M25562 Pain in left knee: Secondary | ICD-10-CM

## 2023-08-08 NOTE — Progress Notes (Signed)
 4+ wks of L knee pain. posterior patella and joint space immediately posterior to the patella are the predominant areas of pain.

## 2023-08-19 ENCOUNTER — Encounter: Payer: Self-pay | Admitting: Gastroenterology

## 2023-09-30 ENCOUNTER — Encounter

## 2023-10-14 ENCOUNTER — Encounter: Admitting: Gastroenterology

## 2023-10-25 ENCOUNTER — Encounter: Payer: Self-pay | Admitting: Gastroenterology

## 2023-10-25 ENCOUNTER — Ambulatory Visit: Admitting: *Deleted

## 2023-10-25 VITALS — Ht 66.0 in | Wt 140.0 lb

## 2023-10-25 DIAGNOSIS — Z1211 Encounter for screening for malignant neoplasm of colon: Secondary | ICD-10-CM

## 2023-10-25 MED ORDER — NA SULFATE-K SULFATE-MG SULF 17.5-3.13-1.6 GM/177ML PO SOLN: 1.0000 | Freq: Once | ORAL | 0 refills | Status: AC

## 2023-10-25 NOTE — Progress Notes (Signed)
 Pt's name and DOB verified at the beginning of the pre-visit with 2 identifiers  Pt denies any difficulty with ambulating,sitting, laying down or rolling side to side  Pt has no issues moving head neck or swallowing  No egg or soy allergy known to patient   No issues known to pt with past sedation  No FH of Malignant Hyperthermia  Pt is not on home 02   Pt is not on blood thinners   Pt denies issues with constipation   Pt is not on dialysis  Pt denise any abnormal heart rhythms   Pt denies any upcoming cardiac testing  Patient's chart reviewed by Norleen Schillings CNRA prior to pre-visit and patient appropriate for the [facility].  Pre-visit completed and red dot placed by patient's name on their procedure day (on provider's schedule).     Visit by phone  Pt states weight is 140 lb   Pt given  both [facility] main # and MD on call # prior to instructions.  Informed pt to come in at the time discussed and is shown on PV instructions.  Pt instructed to use [employer] or GoodRx for a price reduction on prep  Instructed pt where to find PV instructions in My Ch. Copy of instructions  to be sent in mail and address read back to pt to verify correct on envelope. Instructed pt on all aspects of written instructions including med holds clothing to wear and foods to eat and not eat as well as after procedure legal restrictions and to call MD on call if needed.. Pt states understanding. Instructed pt to review instructions again prior to procedure and call main # given if has any questions or any issues. Pt states they will.

## 2023-11-11 ENCOUNTER — Encounter: Payer: Self-pay | Admitting: Gastroenterology

## 2023-11-11 ENCOUNTER — Ambulatory Visit (AMBULATORY_SURGERY_CENTER): Admitting: Gastroenterology

## 2023-11-11 VITALS — BP 121/63 | HR 56 | Temp 97.8°F | Resp 18 | Ht 66.0 in | Wt 140.0 lb

## 2023-11-11 DIAGNOSIS — K6289 Other specified diseases of anus and rectum: Secondary | ICD-10-CM | POA: Diagnosis not present

## 2023-11-11 DIAGNOSIS — K5730 Diverticulosis of large intestine without perforation or abscess without bleeding: Secondary | ICD-10-CM | POA: Diagnosis not present

## 2023-11-11 DIAGNOSIS — D123 Benign neoplasm of transverse colon: Secondary | ICD-10-CM

## 2023-11-11 DIAGNOSIS — Z1211 Encounter for screening for malignant neoplasm of colon: Secondary | ICD-10-CM

## 2023-11-11 DIAGNOSIS — K648 Other hemorrhoids: Secondary | ICD-10-CM

## 2023-11-11 MED ORDER — SODIUM CHLORIDE 0.9 % IV SOLN: 500.0000 mL | INTRAVENOUS | Status: DC

## 2023-11-11 NOTE — Progress Notes (Signed)
 Called to room to assist during endoscopic procedure.  Patient ID and intended procedure confirmed with present staff. Received instructions for my participation in the procedure from the performing physician.

## 2023-11-11 NOTE — Patient Instructions (Signed)

## 2023-11-11 NOTE — Progress Notes (Signed)
 Report to PACU, RN, vss, BBS= Clear.

## 2023-11-11 NOTE — Progress Notes (Signed)
 [facility] History and Physical   Primary Care Physician:  Corrington, Kip A, MD   Reason for Procedure:   Colon cancer screening  Plan:    colonoscopy     HPI: Brianna [name] is a 48 y.o. female  here for colonoscopy screening - first time exam.    . Patient denies any bowel symptoms at this time. No family history of colon cancer known. Otherwise feels well without any cardiopulmonary symptoms.   I have discussed risks / benefits of anesthesia and endoscopic procedure with Asberry Heady and they wish to proceed with the exams as outlined today.    Past Medical History:  Diagnosis Date   Achilles tendinitis    Anxiety    Headache    Hypertension    Knee pain    Nerve damage    both legs   Newborn product of in vitro fertilization (IVF) pregnancy    Plantar fasciitis     Past Surgical History:  Procedure Laterality Date   DILATION AND CURETTAGE OF UTERUS N/A 11/28/2016   Procedure: DILATATION AND CURETTAGE;  Surgeon: Estelle Service, MD;  Location: [facility];  Service: Gynecology;  Laterality: N/A;   WISDOM TOOTH EXTRACTION     WRIST FRACTURE SURGERY     r/wrist    Prior to Admission medications   Medication Sig Start Date End Date Taking? Authorizing Provider  B Complex-C (B-COMPLEX WITH VITAMIN C) tablet Take 1 tablet by mouth daily.   Yes [provider]  estradiol  (VIVELLE -DOT) 0.075 MG/24HR Place onto the skin. 11/06/23  Yes [provider]  progesterone (PROMETRIUM) 100 MG capsule Take 100 mg by mouth daily.   Yes [provider]  RETIN-A 0.05 % cream daily at 12 noon.   Yes [provider]  TESTOSTERONE COMPOUNDING KIT TD Apply 2 % topically. 08/24/23  Yes [provider]  estradiol  (ESTRACE ) 0.01 % CREA vaginal cream as needed.    [provider]  ibuprofen  (ADVIL ) 600 MG tablet as needed.    [provider]  meloxicam  (MOBIC ) 7.5 MG tablet TAKE 1 TABLET BY MOUTH EVERY  DAY Patient not taking: Reported on 10/25/2023 07/30/22   Smith, Zachary M, DO  SEMAGLUTIDE PO daily at 12 noon.    [provider]    Current Outpatient Medications  Medication Sig Dispense Refill   B Complex-C (B-COMPLEX WITH VITAMIN C) tablet Take 1 tablet by mouth daily.     estradiol  (VIVELLE -DOT) 0.075 MG/24HR Place onto the skin.     progesterone (PROMETRIUM) 100 MG capsule Take 100 mg by mouth daily.     RETIN-A 0.05 % cream daily at 12 noon.     TESTOSTERONE COMPOUNDING KIT TD Apply 2 % topically.     estradiol  (ESTRACE ) 0.01 % CREA vaginal cream as needed.     ibuprofen  (ADVIL ) 600 MG tablet as needed.     meloxicam  (MOBIC ) 7.5 MG tablet TAKE 1 TABLET BY MOUTH EVERY DAY (Patient not taking: Reported on 10/25/2023) 30 tablet 0   SEMAGLUTIDE PO daily at 12 noon.     Current Facility-Administered Medications  Medication Dose Route Frequency Provider Last Rate Last Admin   0.9 %  sodium chloride  infusion  500 mL Intravenous Continuous Ebonie Westerlund, Elspeth SQUIBB, MD        Allergies as of 11/11/2023 - Review Complete 11/11/2023  Allergen Reaction Noted   Sulfa antibiotics Nausea And Vomiting 10/12/2014    Family History  Problem Relation Age of Onset   Hyperlipidemia Mother  Hyperlipidemia Father    Colon cancer Neg Hx    Colon polyps Neg Hx    Esophageal cancer Neg Hx    Rectal cancer Neg Hx    Stomach cancer Neg Hx     Social History   Socioeconomic History   Marital status: Married    Spouse name: Not on file   Number of children: Not on file   Years of education: Not on file   Highest education level: Not on file  Occupational History   Not on file  Tobacco Use   Smoking status: Never   Smokeless tobacco: Never  Vaping Use   Vaping status: Never Used  Substance and Sexual Activity   Alcohol use: Yes    Comment: daily   Drug use: No   Sexual activity: Yes    Birth control/protection: None  Other Topics Concern   Not on file  Social History  Narrative   Not on file   Social Drivers of Health   Financial Resource Strain: Not on file  Food Insecurity: Not on file  Transportation Needs: Not on file  Physical Activity: Not on file  Stress: Not on file  Social Connections: Unknown (05/30/2021)   Received from [facility]   Social Network    Social Network: Not on file  Intimate Partner Violence: Unknown (04/21/2021)   Received from [facility]   HITS    Physically Hurt: Not on file    Insult or Talk Down To: Not on file    Threaten Physical Harm: Not on file    Scream or Curse: Not on file    Review of Systems: All other review of systems negative except as mentioned in the HPI.  Physical Exam: Vital signs BP 137/73   Pulse (!) 58   Temp 97.8 F (36.6 C) (Temporal)   Ht 5' 6 (1.676 m)   Wt 140 lb (63.5 kg)   SpO2 98%   BMI 22.60 kg/m   General:   Alert,  Well-developed, pleasant and cooperative in NAD Lungs:  Clear throughout to auscultation.   Heart:  Regular rate and rhythm Abdomen:  Soft, nontender and nondistended.   Neuro/Psych:  Alert and cooperative. Normal mood and affect. A and O x 3  Marcey Naval, MD [facility]

## 2023-11-11 NOTE — Op Note (Signed)
 [facility] Patient Name: Brianna [name] Procedure Date: 11/11/2023 11:15 AM MRN: [id number] Endoscopist: Elspeth P. Leigh , MD, [id number] Age: 48 Referring MD:  Date of Birth: [birth year] Gender: Female Account #: [id number] Procedure:                Colonoscopy Indications:              Screening for colorectal malignant neoplasm, This                            is the patient's first colonoscopy Medicines:                Monitored Anesthesia Care Procedure:                Pre-Anesthesia Assessment:                           - Prior to the procedure, a History and Physical                            was performed, and patient medications and                            allergies were reviewed. The patient's tolerance of                            previous anesthesia was also reviewed. The risks                            and benefits of the procedure and the sedation                            options and risks were discussed with the patient.                            All questions were answered, and informed consent                            was obtained. Prior Anticoagulants: The patient has                            taken no anticoagulant or antiplatelet agents. ASA                            Grade Assessment: II - A patient with mild systemic                            disease. After reviewing the risks and benefits,                            the patient was deemed in satisfactory condition to                            undergo the procedure.  After obtaining informed consent, the colonoscope                            was passed under direct vision. Throughout the                            procedure, the patient's blood pressure, pulse, and                            oxygen saturations were monitored continuously. The                            [id number]-HQ190L Colonoscope [id number] was introduced                            through the anus  and advanced to the the cecum,                            identified by appendiceal orifice and ileocecal                            valve. The colonoscopy was performed without                            difficulty. The patient tolerated the procedure                            well. The quality of the bowel preparation was                            good. The ileocecal valve, appendiceal orifice, and                            rectum were photographed. Scope In: 11:23:43 AM Scope Out: 11:45:25 AM Scope Withdrawal Time: 0 hours 16 minutes 48 seconds  Total Procedure Duration: 0 hours 21 minutes 42 seconds  Findings:                 The perianal and digital rectal examinations were                            normal.                           A 3 to 4 mm polyp was found in the transverse                            colon. The polyp was sessile. The polyp was removed                            with a cold snare. Resection and retrieval were                            complete.  Multiple diverticula were found in the transverse                            colon, left colon and right colon.                           Anal papilla(e) were hypertrophied.                           Internal hemorrhoids were found during                            retroflexion. The hemorrhoids were small.                           The exam was otherwise without abnormality. Complications:            No immediate complications. Estimated blood loss:                            Minimal. Estimated Blood Loss:     Estimated blood loss was minimal. Impression:               - One 3 to 4 mm polyp in the transverse colon,                            removed with a cold snare. Resected and retrieved.                           - Diverticulosis in the transverse colon, in the                            left colon and in the right colon.                           - Anal papilla(e) were hypertrophied.                            - Internal hemorrhoids.                           - The examination was otherwise normal. Recommendation:           - Patient has a contact number available for                            emergencies. The signs and symptoms of potential                            delayed complications were discussed with the                            patient. Return to normal activities tomorrow.                            Written discharge instructions were provided to the  patient.                           - Resume previous diet.                           - Continue present medications.                           - Await pathology results. Elspeth P. Nichelle Renwick, MD 11/11/2023 11:52:03 AM This report has been signed electronically.

## 2023-11-11 NOTE — Progress Notes (Signed)
 Pt's states no medical or surgical changes since previsit or office visit.

## 2023-11-12 ENCOUNTER — Telehealth: Payer: Self-pay

## 2023-11-12 NOTE — Telephone Encounter (Signed)
 Left message on answering machine.

## 2023-11-13 ENCOUNTER — Ambulatory Visit: Payer: Self-pay | Admitting: Gastroenterology

## 2023-11-13 LAB — SURGICAL PATHOLOGY

## 2024-01-08 ENCOUNTER — Ambulatory Visit: Admitting: Family Medicine

## 2024-01-28 ENCOUNTER — Ambulatory Visit: Admitting: Family Medicine
